# Patient Record
Sex: Female | Born: 1960 | Race: White | Hispanic: No | Marital: Married | Smoking: Never smoker
Health system: Southern US, Community
[De-identification: ages and names within clinical notes are randomized; demographics above are authoritative.]

## PROBLEM LIST (undated history)

## (undated) DIAGNOSIS — E785 Hyperlipidemia, unspecified: Secondary | ICD-10-CM

## (undated) DIAGNOSIS — A64 Unspecified sexually transmitted disease: Secondary | ICD-10-CM

## (undated) DIAGNOSIS — E039 Hypothyroidism, unspecified: Secondary | ICD-10-CM

## (undated) DIAGNOSIS — E288 Other ovarian dysfunction: Secondary | ICD-10-CM

## (undated) HISTORY — DX: Unspecified sexually transmitted disease: A64

## (undated) HISTORY — DX: Other ovarian dysfunction: E28.8

## (undated) HISTORY — DX: Hyperlipidemia, unspecified: E78.5

## (undated) HISTORY — DX: Hypothyroidism, unspecified: E03.9

---

## 2000-03-07 DIAGNOSIS — E039 Hypothyroidism, unspecified: Secondary | ICD-10-CM

## 2000-03-07 HISTORY — DX: Hypothyroidism, unspecified: E03.9

## 2000-03-14 ENCOUNTER — Encounter: Admission: RE | Admit: 2000-03-14 | Discharge: 2000-03-14 | Payer: Self-pay | Admitting: Obstetrics and Gynecology

## 2000-03-14 ENCOUNTER — Encounter: Payer: Self-pay | Admitting: Obstetrics and Gynecology

## 2000-05-09 ENCOUNTER — Other Ambulatory Visit: Admission: RE | Admit: 2000-05-09 | Discharge: 2000-05-09 | Payer: Self-pay | Admitting: Obstetrics and Gynecology

## 2001-03-29 ENCOUNTER — Encounter: Admission: RE | Admit: 2001-03-29 | Discharge: 2001-03-29 | Payer: Self-pay | Admitting: Obstetrics and Gynecology

## 2001-03-29 ENCOUNTER — Encounter: Payer: Self-pay | Admitting: Obstetrics and Gynecology

## 2001-08-05 DIAGNOSIS — E2839 Other primary ovarian failure: Secondary | ICD-10-CM

## 2001-08-05 HISTORY — DX: Other primary ovarian failure: E28.39

## 2001-08-22 ENCOUNTER — Other Ambulatory Visit: Admission: RE | Admit: 2001-08-22 | Discharge: 2001-08-22 | Payer: Self-pay | Admitting: Obstetrics and Gynecology

## 2001-08-24 ENCOUNTER — Encounter: Admission: RE | Admit: 2001-08-24 | Discharge: 2001-08-24 | Payer: Self-pay | Admitting: Obstetrics and Gynecology

## 2001-08-24 ENCOUNTER — Encounter: Payer: Self-pay | Admitting: Obstetrics and Gynecology

## 2002-04-02 ENCOUNTER — Encounter: Admission: RE | Admit: 2002-04-02 | Discharge: 2002-04-02 | Payer: Self-pay | Admitting: Obstetrics and Gynecology

## 2002-04-02 ENCOUNTER — Encounter: Payer: Self-pay | Admitting: Obstetrics and Gynecology

## 2002-08-30 ENCOUNTER — Other Ambulatory Visit: Admission: RE | Admit: 2002-08-30 | Discharge: 2002-08-30 | Payer: Self-pay | Admitting: Obstetrics and Gynecology

## 2003-04-04 ENCOUNTER — Encounter: Admission: RE | Admit: 2003-04-04 | Discharge: 2003-04-04 | Payer: Self-pay | Admitting: Obstetrics and Gynecology

## 2003-09-12 ENCOUNTER — Other Ambulatory Visit: Admission: RE | Admit: 2003-09-12 | Discharge: 2003-09-12 | Payer: Self-pay | Admitting: Obstetrics and Gynecology

## 2003-09-26 ENCOUNTER — Encounter: Admission: RE | Admit: 2003-09-26 | Discharge: 2003-09-26 | Payer: Self-pay | Admitting: Obstetrics and Gynecology

## 2004-04-08 ENCOUNTER — Encounter: Admission: RE | Admit: 2004-04-08 | Discharge: 2004-04-08 | Payer: Self-pay | Admitting: Obstetrics and Gynecology

## 2004-09-14 ENCOUNTER — Other Ambulatory Visit: Admission: RE | Admit: 2004-09-14 | Discharge: 2004-09-14 | Payer: Self-pay | Admitting: Obstetrics and Gynecology

## 2005-06-23 ENCOUNTER — Encounter: Admission: RE | Admit: 2005-06-23 | Discharge: 2005-06-23 | Payer: Self-pay | Admitting: Obstetrics and Gynecology

## 2005-09-21 ENCOUNTER — Other Ambulatory Visit: Admission: RE | Admit: 2005-09-21 | Discharge: 2005-09-21 | Payer: Self-pay | Admitting: Obstetrics & Gynecology

## 2006-09-20 ENCOUNTER — Encounter: Admission: RE | Admit: 2006-09-20 | Discharge: 2006-09-20 | Payer: Self-pay | Admitting: Obstetrics and Gynecology

## 2006-09-23 ENCOUNTER — Other Ambulatory Visit: Admission: RE | Admit: 2006-09-23 | Discharge: 2006-09-23 | Payer: Self-pay | Admitting: Obstetrics & Gynecology

## 2006-09-30 ENCOUNTER — Encounter: Admission: RE | Admit: 2006-09-30 | Discharge: 2006-09-30 | Payer: Self-pay | Admitting: Obstetrics and Gynecology

## 2007-09-21 ENCOUNTER — Encounter: Admission: RE | Admit: 2007-09-21 | Discharge: 2007-09-21 | Payer: Self-pay | Admitting: Obstetrics and Gynecology

## 2007-09-26 ENCOUNTER — Other Ambulatory Visit: Admission: RE | Admit: 2007-09-26 | Discharge: 2007-09-26 | Payer: Self-pay | Admitting: Obstetrics & Gynecology

## 2008-09-26 ENCOUNTER — Other Ambulatory Visit: Admission: RE | Admit: 2008-09-26 | Discharge: 2008-09-26 | Payer: Self-pay | Admitting: Obstetrics and Gynecology

## 2009-04-09 ENCOUNTER — Encounter: Admission: RE | Admit: 2009-04-09 | Discharge: 2009-04-09 | Payer: Self-pay | Admitting: Obstetrics & Gynecology

## 2010-01-02 LAB — HM COLONOSCOPY: HM Colonoscopy: 2

## 2010-05-14 ENCOUNTER — Encounter
Admission: RE | Admit: 2010-05-14 | Discharge: 2010-05-14 | Payer: Self-pay | Source: Home / Self Care | Attending: Obstetrics and Gynecology | Admitting: Obstetrics and Gynecology

## 2012-12-01 ENCOUNTER — Ambulatory Visit: Payer: Self-pay | Admitting: Nurse Practitioner

## 2013-01-18 ENCOUNTER — Other Ambulatory Visit: Payer: Self-pay

## 2013-01-18 DIAGNOSIS — Z1231 Encounter for screening mammogram for malignant neoplasm of breast: Secondary | ICD-10-CM

## 2013-02-01 ENCOUNTER — Encounter: Payer: Self-pay | Admitting: Nurse Practitioner

## 2013-02-01 ENCOUNTER — Ambulatory Visit (INDEPENDENT_AMBULATORY_CARE_PROVIDER_SITE_OTHER): Payer: No Typology Code available for payment source | Admitting: Nurse Practitioner

## 2013-02-01 VITALS — BP 144/82 | HR 68 | Resp 16 | Ht 61.25 in | Wt 132.0 lb

## 2013-02-01 DIAGNOSIS — Z0000 Encounter for general adult medical examination without abnormal findings: Secondary | ICD-10-CM

## 2013-02-01 DIAGNOSIS — Z01419 Encounter for gynecological examination (general) (routine) without abnormal findings: Secondary | ICD-10-CM

## 2013-02-01 LAB — POCT URINALYSIS DIPSTICK
Glucose, UA: NEGATIVE
Leukocytes, UA: NEGATIVE
Nitrite, UA: NEGATIVE
pH, UA: 5

## 2013-02-01 NOTE — Patient Instructions (Addendum)
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
## 2013-02-02 NOTE — Progress Notes (Signed)
Encounter reviewed by Dr. Igor Bishop Silva.  

## 2013-02-08 ENCOUNTER — Ambulatory Visit: Payer: Self-pay

## 2013-05-09 ENCOUNTER — Other Ambulatory Visit: Payer: Self-pay | Admitting: Nurse Practitioner

## 2013-05-09 NOTE — Telephone Encounter (Signed)
Patient wants a refill of valACYclovir (VALTREX) 1000 MG tablet  Take 1,000 mg by mouth as needed. , Last Dose: Not Recorded  She only wants it as an as needed bases. Doesn't want a lot at one time just if she needs it she can go get a few at pharmacy [facility] [address] [place] [phone]

## 2013-05-10 MED ORDER — VALACYCLOVIR HCL 1 G PO TABS: 1000.0000 mg | ORAL_TABLET | ORAL | Status: DC | PRN

## 2013-05-10 NOTE — Telephone Encounter (Signed)
Refill request for VALTREX  Last AEX - 02/01/13 Please advise refills and quantities.  Thanks.

## 2013-09-05 ENCOUNTER — Ambulatory Visit
Admission: RE | Admit: 2013-09-05 | Discharge: 2013-09-05 | Disposition: A | Discharge status: Home / Self Care | Payer: No Typology Code available for payment source | Source: Ambulatory Visit

## 2013-09-05 DIAGNOSIS — Z1231 Encounter for screening mammogram for malignant neoplasm of breast: Secondary | ICD-10-CM

## 2013-12-10 ENCOUNTER — Other Ambulatory Visit: Payer: Self-pay | Admitting: Family Medicine

## 2013-12-10 DIAGNOSIS — R1013 Epigastric pain: Secondary | ICD-10-CM

## 2013-12-17 ENCOUNTER — Encounter (INDEPENDENT_AMBULATORY_CARE_PROVIDER_SITE_OTHER): Payer: Self-pay

## 2013-12-17 ENCOUNTER — Ambulatory Visit
Admission: RE | Admit: 2013-12-17 | Discharge: 2013-12-17 | Disposition: A | Discharge status: Home / Self Care | Payer: No Typology Code available for payment source | Source: Ambulatory Visit | Attending: Family Medicine | Admitting: Family Medicine

## 2013-12-17 DIAGNOSIS — R1013 Epigastric pain: Secondary | ICD-10-CM

## 2014-02-05 ENCOUNTER — Ambulatory Visit: Payer: No Typology Code available for payment source | Admitting: Nurse Practitioner

## 2014-02-14 ENCOUNTER — Encounter: Payer: Self-pay | Admitting: Nurse Practitioner

## 2014-02-25 ENCOUNTER — Ambulatory Visit: Payer: No Typology Code available for payment source | Admitting: Nurse Practitioner

## 2014-04-08 ENCOUNTER — Encounter: Payer: Self-pay | Admitting: Nurse Practitioner

## 2014-04-12 ENCOUNTER — Ambulatory Visit: Payer: No Typology Code available for payment source | Admitting: Nurse Practitioner

## 2014-04-23 ENCOUNTER — Ambulatory Visit (INDEPENDENT_AMBULATORY_CARE_PROVIDER_SITE_OTHER): Payer: No Typology Code available for payment source | Admitting: Nurse Practitioner

## 2014-04-23 ENCOUNTER — Encounter: Payer: Self-pay | Admitting: Nurse Practitioner

## 2014-04-23 VITALS — BP 137/60 | HR 72 | Ht 61.25 in | Wt 140.0 lb

## 2014-04-23 DIAGNOSIS — Z0000 Encounter for general adult medical examination without abnormal findings: Secondary | ICD-10-CM

## 2014-04-23 DIAGNOSIS — M8580 Other specified disorders of bone density and structure, unspecified site: Secondary | ICD-10-CM

## 2014-04-23 DIAGNOSIS — N8189 Other female genital prolapse: Secondary | ICD-10-CM

## 2014-04-23 DIAGNOSIS — Z01419 Encounter for gynecological examination (general) (routine) without abnormal findings: Secondary | ICD-10-CM

## 2014-04-23 DIAGNOSIS — R8290 Unspecified abnormal findings in urine: Secondary | ICD-10-CM

## 2014-04-23 LAB — POCT URINALYSIS DIPSTICK
Bilirubin, UA: NEGATIVE
GLUCOSE UA: NEGATIVE
Leukocytes, UA: NEGATIVE
NITRITE UA: NEGATIVE
PROTEIN UA: NEGATIVE
Urobilinogen, UA: NEGATIVE
pH, UA: 5

## 2014-04-23 MED ORDER — VALACYCLOVIR HCL 1 G PO TABS: 1000.0000 mg | ORAL_TABLET | ORAL | Status: DC | PRN

## 2014-04-23 NOTE — Progress Notes (Signed)
53 y.o. 402P2002 Married Caucasian Fe here for annual exam.  History of high hamstring tendonitis of bilateral thighs since. Nov 2013.  Now better.  She had an incident on Nov 8th about midmorning where she noted a vaginal protrusion after moving some Christmas decorations.  Since then she has done some research into types of uterine and pelvic relaxation.  She has also looked at surgical and non surgical options.  She desires more information and discussion with a surgeon to see what she needs to have done.  She has been seen by Dr. Perley JainMcDiarmid in 2009 who told her repair of the rectocele would have to be done at some time.  She is postmenopausal not on HRT.  She does use OTC Replens prn.  Not SA secondary to pain.  Patient's last menstrual period was 04/16/2002.          Sexually active: Yes.    The current method of family planning is post menopausal status.    Exercising: Yes.    Home exercise routine includes biking 6 - 7 miles a day. Smoker:  no  Health Maintenance: Pap:  01/26/12, WNL, neg HR HPV MMG:  09/05/13 Bi-Rads Neg Colonoscopy:  01/02/10, 2 polyps benign repeat in 10 years BMD:   04/09/2009, T score: Spine -1.6, left hip -1.0 TDaP:  09/2006  Labs: PCP                UA: ketones - moderate, RBC - trace, ph: 5.0   reports that she has never smoked. She has never used smokeless tobacco. She reports that she does not drink alcohol or use illicit drugs.  Past Medical History  Diagnosis Date  . Hyperlipidemia   . STD (sexually transmitted disease)     HSV  . Premature ovarian failure 08/2001  . Hypothyroid 03/2000    Past Surgical History  Procedure Laterality Date  . Cesarean section      Current Outpatient Prescriptions  Medication Sig Dispense Refill  . atorvastatin (LIPITOR) 10 MG tablet Take 1 tablet by mouth daily.    Marland Kitchen. CALCIUM PO Take 2,568 mg by mouth daily. Supplement and dietary intake    . levothyroxine (SYNTHROID, LEVOTHROID) 75 MCG tablet Take 1 tablet by mouth daily.     . Multiple Vitamin (MULTIVITAMIN) tablet Take 1 tablet by mouth daily.    . NON FORMULARY Take 1,948 Units by mouth daily. VITAMIN D. Supplement and dietary    . valACYclovir (VALTREX) 1000 MG tablet Take 1 tablet (1,000 mg total) by mouth as needed. 30 tablet 12   No current facility-administered medications for this visit.    Family History  Problem Relation Age of Onset  . Stroke Mother   . Rheum arthritis Mother   . Cancer Maternal Grandmother 762    ovarian    ROS:  Pertinent items are noted in HPI.  Otherwise, a comprehensive ROS was negative.  Exam:   BP 137/60 mmHg  Pulse 72  Ht 5' 1.25" (1.556 m)  Wt 140 lb (63.504 kg)  BMI 26.23 kg/m2  LMP 04/16/2002 Height: 5' 1.25" (155.6 cm)  Ht Readings from Last 3 Encounters:  04/23/14 5' 1.25" (1.556 m)  02/01/13 5' 1.25" (1.556 m)    General appearance: alert, cooperative and appears stated age Head: Normocephalic, without obvious abnormality, atraumatic Neck: no adenopathy, supple, symmetrical, trachea midline and thyroid normal to inspection and palpation Lungs: clear to auscultation bilaterally Breasts: normal appearance, no masses or tenderness Heart: regular rate and rhythm Abdomen:  soft, non-tender; no masses,  no organomegaly Extremities: extremities normal, atraumatic, no cyanosis or edema Skin: Skin color, texture, turgor normal. No rashes or lesions Lymph nodes: Cervical, supraclavicular, and axillary nodes normal. No abnormal inguinal nodes palpated Neurologic: Grossly normal   Pelvic: External genitalia:  no lesions              Urethra:  normal appearing urethra with no masses, tenderness or lesions              Bartholin's and Skene's: normal                 Vagina: very atrophic appearing vagina with pale color and discharge, no lesions              Cervix: anteverted              Pap taken: No. Bimanual Exam:  Uterus:  normal size, contour, position, consistency, mobility, non-tender               Adnexa: no mass, fullness, tenderness               Rectovaginal: Confirms               Anus:  normal sphincter tone, no lesions  A:  Well Woman with normal exam  History of POF 3/03 at age 53  Postmenopausal no HRT  Atrophic vaginitis - has not wanted vaginal estrogen  Hypothyroid 2001 Osteopenia - repeat BMD next spring  Rectocele and pelvic floor relaxation  History of HSV  P:   Reviewed health and wellness pertinent to exam  Pap smear not taken today  Mammogram is due 4/16 and will get BMD at same time - order is placed in Epic  Will get PUS and follow with consult with Dr. Hyacinth MeekerMiller - she is aware of patient and her concerns.  She is shown Pessary types.  Refill on Valtrex for a year  Follow with urine C&S  Counseled on breast self exam, mammography screening, adequate intake of calcium and vitamin D, diet and exercise, Kegel's exercises return annually or prn  An After Visit Summary was printed and given to the patient.

## 2014-04-23 NOTE — Progress Notes (Signed)
Reviewed personally.  M. Suzanne Tanina Barb, MD.  

## 2014-04-23 NOTE — Patient Instructions (Signed)

## 2014-04-24 LAB — URINE CULTURE: Colony Count: 9000

## 2014-04-26 ENCOUNTER — Telehealth: Payer: Self-pay | Admitting: Nurse Practitioner

## 2014-04-26 NOTE — Addendum Note (Signed)
Addended by: Joeseph AmorFAST, Letita Prentiss L on: 04/26/2014 11:50 AM   Modules accepted: Orders

## 2014-04-26 NOTE — Telephone Encounter (Signed)
Left message for patient to call back. Need to go over benefits and schedule PUS °

## 2014-04-26 NOTE — Telephone Encounter (Signed)
Patient returned call. I advised patient that per benefit quote received, she will be responsible for $360.24 when she comes in for PUS. Patient agreeable. Scheduled PUS. Advised patient of 72 hour cancellation policy and $100 cancellation fee. Patient agreeable. (since today is Friday, patient must notify us by Friday night)

## 2014-04-30 ENCOUNTER — Ambulatory Visit (INDEPENDENT_AMBULATORY_CARE_PROVIDER_SITE_OTHER): Payer: No Typology Code available for payment source | Admitting: Obstetrics & Gynecology

## 2014-04-30 ENCOUNTER — Ambulatory Visit (INDEPENDENT_AMBULATORY_CARE_PROVIDER_SITE_OTHER): Payer: No Typology Code available for payment source

## 2014-04-30 VITALS — BP 138/88 | Ht 61.25 in | Wt 142.0 lb

## 2014-04-30 DIAGNOSIS — N8110 Cystocele, unspecified: Secondary | ICD-10-CM

## 2014-04-30 DIAGNOSIS — N816 Rectocele: Secondary | ICD-10-CM

## 2014-04-30 DIAGNOSIS — IMO0002 Reserved for concepts with insufficient information to code with codable children: Secondary | ICD-10-CM

## 2014-04-30 DIAGNOSIS — N8189 Other female genital prolapse: Secondary | ICD-10-CM

## 2014-04-30 NOTE — Progress Notes (Signed)
53 y.o. G2P2 Marriedfemale with pelvic relaxation here for PUS and to discuss treatment options.  Pt does feel a vaginal bulge at times.  Denies any issues with emptying bladder.  No incontinence.  Denies vaginal bleeding or discharge.  Has done a lot of research and has many questions.  Brought a list with her today.  Patient's last menstrual period was 04/16/2002.  Sexually active:  yes  Contraception: PMP state  FINDINGS: UTERUS: 5.0 x 4.4 x 2.5cm EMS: 1.749mm ADNEXA:   Left ovary 1.6 x 0.8 x 0.8cm   Right ovary 1.9 x 0.9 x 1.0cm CUL DE SAC:  Free fluid noted in PCS measuring 3.4 x 1.2 x 3.5cm and appearance of peritoneal inclusion cysts noted  Images reviewed with pt.  Pt reports normal labs with PCP this year.  Physical exam performed on pt showing pelvic relaxation with cystocele and rectocele.  Findings d/w pt.  PT, pessary use, surgical correction d/w pt.  She is not sure she wants to proceed with anything right now as she is really not symptomatic in any other way.    Assessment:  Normal PUS except for peritoneal inclusion cysts and pelvic relaxation.  Plan: Pt considering options.  Will call back if decides she needs to proceed with any additional consultation.  For now, she thinks she will wait.  ~15 minutes spent with patient >50% of time was in face to face discussion of above.

## 2014-05-10 ENCOUNTER — Encounter: Payer: Self-pay | Admitting: Obstetrics & Gynecology

## 2014-05-10 DIAGNOSIS — IMO0002 Reserved for concepts with insufficient information to code with codable children: Secondary | ICD-10-CM | POA: Insufficient documentation

## 2014-05-10 DIAGNOSIS — N816 Rectocele: Secondary | ICD-10-CM | POA: Insufficient documentation

## 2014-12-05 ENCOUNTER — Emergency Department (HOSPITAL_COMMUNITY): Payer: 59

## 2014-12-05 ENCOUNTER — Emergency Department (HOSPITAL_COMMUNITY)
Admission: EM | Admit: 2014-12-05 | Discharge: 2014-12-05 | Disposition: A | Discharge status: Home / Self Care | Payer: 59 | Attending: Emergency Medicine | Admitting: Emergency Medicine

## 2014-12-05 ENCOUNTER — Encounter (HOSPITAL_COMMUNITY): Payer: Self-pay | Admitting: *Deleted

## 2014-12-05 DIAGNOSIS — Z79899 Other long term (current) drug therapy: Secondary | ICD-10-CM | POA: Diagnosis not present

## 2014-12-05 DIAGNOSIS — E039 Hypothyroidism, unspecified: Secondary | ICD-10-CM | POA: Diagnosis not present

## 2014-12-05 DIAGNOSIS — Z880 Allergy status to penicillin: Secondary | ICD-10-CM | POA: Insufficient documentation

## 2014-12-05 DIAGNOSIS — Z8619 Personal history of other infectious and parasitic diseases: Secondary | ICD-10-CM | POA: Diagnosis not present

## 2014-12-05 DIAGNOSIS — R0989 Other specified symptoms and signs involving the circulatory and respiratory systems: Secondary | ICD-10-CM | POA: Diagnosis present

## 2014-12-05 DIAGNOSIS — E785 Hyperlipidemia, unspecified: Secondary | ICD-10-CM | POA: Diagnosis not present

## 2014-12-05 DIAGNOSIS — J392 Other diseases of pharynx: Secondary | ICD-10-CM | POA: Diagnosis not present

## 2014-12-05 NOTE — Consult Note (Signed)
Michelle Grimes, Michelle Grimes [id number] 08/24/1960 Mancel BaleElliott Wentz, MD  Reason for Consult: possible fish bone ingestion  HPI: 53yo who was eating salmon last night and thinks a fish bone became lodged in her nose or oropharynx. ENt consulted for the above  Allergies:  Allergies  Allergen Reactions  . Amoxicillin   . Azithromycin     ROS: Review of systems postive for sore throat, globus sensation, otherwise normal other x 12 systems except per HPI.  PMH:  Past Medical History  Diagnosis Date  . Hyperlipidemia   . STD (sexually transmitted disease)     HSV  . Premature ovarian failure 08/2001  . Hypothyroid 03/2000    FH:  Family History  Problem Relation Age of Onset  . Stroke Mother   . Rheum arthritis Mother   . Cancer Maternal Grandmother 5162    ovarian    SH:  History   Social History  . Marital Status: Married    Spouse Name: N/A  . Number of Children: N/A  . Years of Education: N/A   Occupational History  . Not on file.   Social History Main Topics  . Smoking status: Never Smoker   . Smokeless tobacco: Never Used  . Alcohol Use: No  . Drug Use: No  . Sexual Activity:    Partners: Male    Birth Control/ Protection: Surgical     Comment: vasectomy   Other Topics Concern  . Not on file   Social History Narrative    PSH:  Past Surgical History  Procedure Laterality Date  . Cesarean section      Physical  Exam: CN 2-12 grossly intact and symmetric. EAC/TMs normal BL. Oral cavity, lips, gums, ororpharynx normal with no masses or lesions. Skin warm and dry. Nasal cavity without polyps or purulence. External nose and ears without masses or lesions. EOMI, PERRLA. Neck supple with no masses or lesions. No lymphadenopathy palpated. Thyroid normal with no masses. Larynx cannot be seen on transoral exam.  Procedure Note: 31575 Informed verbal consent was obtained after explaining the risks (including bleeding and infection), benefits and alternatives of the procedure.  Verbal timeout was performed prior to the procedure. The nose was topicalized with topical lidocaine/oxymetazoline. The 4mm flexible scope was advanced through the bilateral  nasal cavity. The septum and turbinates appeared normal. The middle meatus was free of polyps or purulence. The eustachian tube, choana, and adenoids were normal in appearance. The hypopharynx, arytenoids, false vocal folds, and true vocal folds appeared normal with normal adduction and abduction bilaterally. The visualized portion of the subglottis appeared normal. The patient tolerated the procedure with no immediate complications. No fishbones, masses, lesions, or foreign bodies were seen anywhere in the nasopharynx, larynx, or oropharynx.  IMAGING: CT neck without contrast and lateral neck Xray show no foreign bodies or masses.  A/P: CT neck, lateral neck Xray, and flexible laryngoscopy all show no nasal, pharyngeal, laryngeal, or esophageal foreign bodies or fish bones. Follow up as needed.   Melvenia BeamGore, Verlene Glantz 12/05/2014 5:17 PM

## 2014-12-05 NOTE — ED Notes (Signed)
Paged Ortho for PA-Cartner

## 2014-12-05 NOTE — ED Provider Notes (Signed)
CSN: [id number]     Arrival date & time 12/05/14  1418 History   First MD Initiated Contact with Patient 12/05/14 1533     Chief Complaint  Patient presents with  . possible bone in throat      (Consider location/radiation/quality/duration/timing/severity/associated sxs/prior Treatment) HPI Michelle Grimes is a 54 y.o. female who comes in for evaluation of potential swallowed foreign body. Patient states last night at approximately 7:00 PM she was eating salmon and may have swallowed a fishbone. She reports immediate discomfort after swallowing the fish. She feels like the bone may be lodged "in the back of my nasal cavity". She denies any difficulties breathing, swallowing, no coughing, chest pain. She has tried swallowing bread and marshmallows to try to dislodge the item, but without success. [facility] who told her that Dr. Emeline DarlingGore from ENT would meet her in the emergency department.  Past Medical History  Diagnosis Date  . Hyperlipidemia   . STD (sexually transmitted disease)     HSV  . Premature ovarian failure 08/2001  . Hypothyroid 03/2000   Past Surgical History  Procedure Laterality Date  . Cesarean section     Family History  Problem Relation Age of Onset  . Stroke Mother   . Rheum arthritis Mother   . Cancer Maternal Grandmother 5762    ovarian   History  Substance Use Topics  . Smoking status: Never Smoker   . Smokeless tobacco: Never Used  . Alcohol Use: No   OB History    Gravida Para Term Preterm AB TAB SAB Ectopic Multiple Living   2 2 2       2      Review of Systems A 10 point review of systems was completed and was negative except for pertinent positives and negatives as mentioned in the history of present illness     Allergies  Amoxicillin and Azithromycin  Home Medications   Prior to Admission medications   Medication Sig Start Date End Date Taking? Authorizing Provider  ALPRAZolam Prudy Feeler(XANAX) 0.25 MG tablet Take 0.25 mg by  mouth daily as needed for anxiety.  11/22/14  Yes Historical Provider, MD  atorvastatin (LIPITOR) 10 MG tablet Take 10 mg by mouth daily at 6 PM.  01/16/13  Yes Historical Provider, MD  CALCIUM PO Take 2,568 mg by mouth at bedtime. Supplement and dietary intake   Yes Historical Provider, MD  levothyroxine (SYNTHROID, LEVOTHROID) 75 MCG tablet Take 75 mcg by mouth daily before breakfast.  12/29/12  Yes Historical Provider, MD  Multiple Vitamin (MULTIVITAMIN) tablet Take 1 tablet by mouth at bedtime.    Yes Historical Provider, MD  NON FORMULARY Take 1,948 Units by mouth at bedtime. VITAMIN D. Supplement and dietary   Yes Historical Provider, MD  sodium chloride (OCEAN) 0.65 % SOLN nasal spray Place 1 spray into both nostrils at bedtime.   Yes Historical Provider, MD  valACYclovir (VALTREX) 1000 MG tablet Take 1 tablet (1,000 mg total) by mouth as needed. Patient taking differently: Take 1,000 mg by mouth as needed (outbreak).  04/23/14  Yes Ria CommentPatricia Grubb, FNP   BP 165/81 mmHg  Pulse 85  Temp(Src) 98.4 F (36.9 C) (Oral)  Resp 17  Ht 5\' 1"  (1.549 m)  Wt 147 lb (66.679 kg)  BMI 27.79 kg/m2  SpO2 100%  LMP 04/16/2002 Physical Exam  Constitutional: She is oriented to person, place, and time. She appears well-developed and well-nourished.  HENT:  Head: Normocephalic and atraumatic.  Mouth/Throat: Oropharynx is clear and moist. No oropharyngeal exudate.  No evidence of foreign body. Bilateral nares clear without any rhinorrhea, erythema.  Eyes: Conjunctivae are normal. Pupils are equal, round, and reactive to light. Right eye exhibits no discharge. Left eye exhibits no discharge. No scleral icterus.  Neck: Neck supple.  Cardiovascular: Normal rate, regular rhythm and normal heart sounds.   Pulmonary/Chest: Effort normal and breath sounds normal. No respiratory distress. She has no wheezes. She has no rales.  Abdominal: Soft. There is no tenderness.  Musculoskeletal: She exhibits no tenderness.   Neurological: She is alert and oriented to person, place, and time.  Cranial Nerves II-XII grossly intact  Skin: Skin is warm and dry. No rash noted.  Psychiatric: She has a normal mood and affect.  Nursing note and vitals reviewed.   ED Course  Procedures (including critical care time) Labs Review Labs Reviewed - No data to display  Imaging Review Dg Neck Soft Tissue  12/05/2014   CLINICAL DATA:  54 year old female with a history of foreign body in the throat.  EXAM: NECK SOFT TISSUES - 1+ VIEW  COMPARISON:  None.  FINDINGS: Unremarkable appearance of the cervical elements which maintain anatomic alignment.  Posterior cervical soft tissues unremarkable.  Vague linear density within the supraglottic prevertebral soft tissues anterior to C4, inferior to the hyoid and superior to the thyroid cartilage measuring approximately 2.3 cm.  IMPRESSION: Linear density within the supraglottic prevertebral soft tissues anterior to C4. This is nonspecific, potentially representing vascular calcifications, though could potentially represent a foreign body given the history of foreign body ingestion.  Signed,  Yvone Neu. Loreta Ave, DO  [facility]  [facility]   Electronically Signed   By: Gilmer Mor D.O.   On: 12/05/2014 16:04   Ct Soft Tissue Neck Wo Contrast  12/05/2014   CLINICAL DATA:  Pt feels like a fish bone is stuck in right side of throat at base of nasal cavity. Pt denies any problems with breathing or swallowing.  EXAM: CT NECK WITHOUT CONTRAST  TECHNIQUE: Multidetector CT imaging of the neck was performed following the standard protocol without intravenous contrast.  COMPARISON:  Soft tissue neck radiographs, 12/05/2014.  FINDINGS: There is no radiopaque foreign body within the airway, visible esophagus or soft tissues. No evidence of a fish bone.  Airway is widely patent. Incidental note is made of a right posterior lateral tracheal diverticulum just  below the thoracic inlet.  The radio opacity noted on the current standard radiographs reflects calcification of the arytenoid cartilages, a normal finding.  Limited intracranial: Unremarkable.  Limited globes and orbits: Unremarkable.  Sinuses and mastoid air cells: Visualized sinuses and mastoid air cells are clear.  Lymph nodes:  No pathologically enlarged lymph nodes.  Major salivary glands:  Unremarkable.  Musculoskeletal:  Unremarkable.  Lung apices: Clear.  IMPRESSION: No evidence of a radiopaque foreign body. No acute finding. No mass or adenopathy.   Electronically Signed   By: Amie Portland M.D.   On: 12/05/2014 17:00     EKG Interpretation None     Meds given in ED:  Medications - No data to display  New Prescriptions   No medications on file   Filed Vitals:   12/05/14 1447 12/05/14 1530 12/05/14 1531  BP: 171/85 165/81 165/81  Pulse: 86 89 85  Temp: 98.4 F (36.9 C)    TempSrc: Oral    Resp: 16  17  Height: 5\' 1"  (1.549 m)  Weight: 147 lb (66.679 kg)    SpO2: 98% 96% 100%    MDM  Vitals stable - WNL -afebrile Pt resting comfortably in ED. PE--physical exam is not concerning. No obvious foreign body detected in the oropharynx or nasopharynx. Normal lung exam, patent airway. Spoke with Dr. Emeline Darling, ENT requests CT neck soft tissue without contrast. CT neck shows no evidence of swallowed foreign body. Dr. Emeline Darling saw patient in ED. No evidence of swallowed foreign body.  I personally reviewed the imaging and agree with the results as interpreted by the radiologist.  I discussed all relevant lab findings and imaging results with pt and they verbalized understanding. Discussed f/u with PCP within 48 hrs and return precautions, pt very amenable to plan.  Final diagnoses:  Throat irritation       Joycie Peek, PA-C 12/05/14 1827  Mancel Bale, MD 12/05/14 [phone]

## 2014-12-05 NOTE — ED Notes (Addendum)
Pt states she feels as if she has a fish bone stuck in there throat since last night.  Called her pcp and they stated to come here.  No difficulty swallowing.  Airway patent.  Pt states she thinks the ENT was supposed to meet her here, but she's not sure.

## 2014-12-05 NOTE — Discharge Instructions (Signed)
You were evaluated in the ED today and there does not appear to be any emergent cause for your throat symptoms at this time. There does not appear to be a fishbone lodged in your throat at this time. Please follow-up with primary care for further evaluation management of your symptoms.

## 2014-12-05 NOTE — ED Notes (Signed)
Pt A&OX4, ambulatory at d/c with steady gait, NAD 

## 2014-12-05 NOTE — ED Notes (Signed)
Pt reports she was eating Salmon last night at 6pm and thinks she has a piece lodged in her nasal cavity. She states she doesn't think it is in her throat but more so in her nasal cavity. She denies painful swallowing, coughing, denies coughing up blood and denies trouble breathing.

## 2014-12-13 ENCOUNTER — Other Ambulatory Visit: Payer: Self-pay | Admitting: Family Medicine

## 2014-12-13 ENCOUNTER — Ambulatory Visit
Admission: RE | Admit: 2014-12-13 | Discharge: 2014-12-13 | Disposition: A | Discharge status: Home / Self Care | Payer: 59 | Source: Ambulatory Visit | Attending: Family Medicine | Admitting: Family Medicine

## 2014-12-13 DIAGNOSIS — R109 Unspecified abdominal pain: Secondary | ICD-10-CM

## 2015-02-28 ENCOUNTER — Telehealth: Payer: Self-pay | Admitting: Nurse Practitioner

## 2015-02-28 NOTE — Telephone Encounter (Signed)
As GYN we do not write those kind of notes and must come from PCP.

## 2015-02-28 NOTE — Telephone Encounter (Signed)
Routing to Patricia Grubb, FNP for review and advise. 

## 2015-02-28 NOTE — Telephone Encounter (Signed)
Patient would like Michelle Grimes to write a letter or note excusing her from jury duty on May 07, 2015 because of her high anxiety levels.

## 2015-03-03 NOTE — Telephone Encounter (Signed)
Spoke with patient. Advised of message as seen below from Ria Comment, FNP. Patient is agreeable and verbalizes understanding. Will contact her PCP regarding letter.  Routing to provider for final review. Patient agreeable to disposition. Will close encounter.

## 2015-05-06 ENCOUNTER — Ambulatory Visit: Payer: No Typology Code available for payment source | Admitting: Nurse Practitioner

## 2015-06-18 ENCOUNTER — Ambulatory Visit (INDEPENDENT_AMBULATORY_CARE_PROVIDER_SITE_OTHER): Payer: BLUE CROSS/BLUE SHIELD | Admitting: Nurse Practitioner

## 2015-06-18 ENCOUNTER — Other Ambulatory Visit: Payer: Self-pay

## 2015-06-18 ENCOUNTER — Encounter: Payer: Self-pay | Admitting: Nurse Practitioner

## 2015-06-18 VITALS — BP 142/86 | HR 96 | Ht 61.0 in | Wt 135.0 lb

## 2015-06-18 DIAGNOSIS — M8580 Other specified disorders of bone density and structure, unspecified site: Secondary | ICD-10-CM | POA: Diagnosis not present

## 2015-06-18 DIAGNOSIS — Z1211 Encounter for screening for malignant neoplasm of colon: Secondary | ICD-10-CM | POA: Diagnosis not present

## 2015-06-18 DIAGNOSIS — Z01419 Encounter for gynecological examination (general) (routine) without abnormal findings: Secondary | ICD-10-CM | POA: Diagnosis not present

## 2015-06-18 DIAGNOSIS — Z0000 Encounter for general adult medical examination without abnormal findings: Secondary | ICD-10-CM | POA: Diagnosis not present

## 2015-06-18 DIAGNOSIS — Z1231 Encounter for screening mammogram for malignant neoplasm of breast: Secondary | ICD-10-CM

## 2015-06-18 DIAGNOSIS — N8189 Other female genital prolapse: Secondary | ICD-10-CM | POA: Diagnosis not present

## 2015-06-18 LAB — POCT URINALYSIS DIPSTICK
Bilirubin, UA: NEGATIVE
Blood, UA: NEGATIVE
Glucose, UA: NEGATIVE
KETONES UA: NEGATIVE
Leukocytes, UA: NEGATIVE
Nitrite, UA: NEGATIVE
PH UA: 7
PROTEIN UA: NEGATIVE
Urobilinogen, UA: NEGATIVE

## 2015-06-18 NOTE — Progress Notes (Signed)
Patient ID: Michelle Grimes, female   DOB: Apr 09, 1961, 55 y.o.   MRN: [id number]   55 y.o. [id number] Married  Caucasian Fe here for annual exam.  Doing well this past year.  Husband is now retired and they have time to be together. She will schedule this month for a mammogram and will get BMD as well.  Since last here for AEX, she had evaluation for cystocele with a normal PUS 04/2014.  She declined other treatment options.  She has limited her activities that may cause her to push or pull so that she has less distress over the pelvic floor weakness.  She has seen Uro in the past.  Patient's last menstrual period was 04/16/2002 (exact date).          Sexually active: Yes.    The current method of family planning is post menopausal status.    Exercising: Yes.    biking 40-50 miles (on pretty days) per week  Smoker:  no  Health Maintenance: Pap: 01/26/12, Negative with neg HR HPV MMG: 09/05/13 Bi-Rads 1: Negative scheduled this month Colonoscopy: 01/02/10, 2 polyps benign repeat in 10 years BMD: 04/09/2009, T score: Spine -1.6, left hip -1.0 TDaP: 10/05/06 Hep C and HIV: will complete today Labs: PCP takes care of all labs  Urine: negative   reports that she has never smoked. She has never used smokeless tobacco. She reports that she does not drink alcohol or use illicit drugs.  Past Medical History  Diagnosis Date  . Hyperlipidemia   . STD (sexually transmitted disease)     HSV  . Premature ovarian failure 08/2001  . Hypothyroid 03/2000    Past Surgical History  Procedure Laterality Date  . Cesarean section      Current Outpatient Prescriptions  Medication Sig Dispense Refill  . ALPRAZolam (XANAX) 0.25 MG tablet Take 0.25 mg by mouth daily as needed for anxiety.   0  . atorvastatin (LIPITOR) 10 MG tablet Take 10 mg by mouth daily at 6 PM.     . CALCIUM PO Take 2,568 mg by mouth at bedtime. Supplement and dietary intake    . levothyroxine (SYNTHROID, LEVOTHROID) 75 MCG tablet Take 75  mcg by mouth daily before breakfast.     . Multiple Vitamin (MULTIVITAMIN) tablet Take 1 tablet by mouth at bedtime.     . NON FORMULARY Take 1,948 Units by mouth at bedtime. VITAMIN D. Supplement and dietary    . omeprazole (PRILOSEC) 40 MG capsule Take 1 capsule by mouth daily.  1  . sodium chloride (OCEAN) 0.65 % SOLN nasal spray Place 1 spray into both nostrils at bedtime.    . valACYclovir (VALTREX) 1000 MG tablet Take 1 tablet (1,000 mg total) by mouth as needed. (Patient taking differently: Take 1,000 mg by mouth as needed (outbreak). ) 30 tablet 12   No current facility-administered medications for this visit.    Family History  Problem Relation Age of Onset  . Stroke Mother   . Rheum arthritis Mother   . Cancer Maternal Grandmother 70    ovarian    ROS:  Pertinent items are noted in HPI.  Otherwise, a comprehensive ROS was negative.  Exam:   BP 142/86 mmHg  Pulse 96  Ht 5\' 1"  (1.549 m)  Wt 135 lb (61.236 kg)  BMI 25.52 kg/m2  LMP 04/16/2002 (Exact Date) Height: 5\' 1"  (154.9 cm) Ht Readings from Last 3 Encounters:  06/18/15 5\' 1"  (1.549 m)  12/05/14 5\' 1"  (1.549 m)  04/30/14 5' 1.25" (1.556 m)    General appearance: alert, cooperative and appears stated age Head: Normocephalic, without obvious abnormality, atraumatic Neck: no adenopathy, supple, symmetrical, trachea midline and thyroid normal to inspection and palpation Lungs: clear to auscultation bilaterally Breasts: normal appearance, no masses or tenderness Heart: regular rate and rhythm Abdomen: soft, non-tender; no masses,  no organomegaly Extremities: extremities normal, atraumatic, no cyanosis or edema Skin: Skin color, texture, turgor normal. No rashes or lesions Lymph nodes: Cervical, supraclavicular, and axillary nodes normal. No abnormal inguinal nodes palpated Neurologic: Grossly normal   Pelvic: External genitalia:  no lesions              Urethra:  normal appearing urethra with no masses,  tenderness or lesions              Bartholin's and Skene's: normal                 Vagina: normal appearing vagina with normal color and discharge, no lesions, cystocele and mild rectocele              Cervix: anteverted              Pap taken: Yes.   Bimanual Exam:  Uterus:  normal size, contour, position, consistency, mobility, non-tender              Adnexa: no mass, fullness, tenderness               Rectovaginal: Confirms               Anus:  normal sphincter tone, no lesions  Chaperone present: yes  A:  Well Woman with normal exam  History of POF 3/03 at age 55 Postmenopausal no HRT Atrophic vaginitis - has not wanted vaginal estrogen Hypothyroid 2001 Osteopenia - will repeat BMD this month Cystocele, Rectocele and pelvic floor relaxation History of HSV  P:   Reviewed health and wellness pertinent to exam  Pap smear as above  Mammogram is due and will schedule along with BMD  Follow with labs  IFOB is given  Will get urology referral for - pelvic PT for symptomatic pelvic floor weakness  Counseled on breast self exam, mammography screening, adequate intake of calcium and vitamin D, diet and exercise, Kegel's exercises return annually or prn  An After Visit Summary was printed and given to the patient.

## 2015-06-18 NOTE — Patient Instructions (Signed)

## 2015-06-19 LAB — HIV ANTIBODY (ROUTINE TESTING W REFLEX): HIV 1&2 Ab, 4th Generation: NONREACTIVE

## 2015-06-19 LAB — HEPATITIS C ANTIBODY: HCV AB: NEGATIVE

## 2015-06-20 LAB — IPS PAP TEST WITH HPV

## 2015-06-21 NOTE — Progress Notes (Signed)
Encounter reviewed by Dr. Brook Amundson C. Silva.  

## 2015-06-23 LAB — HEMOGLOBIN, FINGERSTICK: HEMOGLOBIN, FINGERSTICK: 14 g/dL (ref 12.0–16.0)

## 2015-07-16 ENCOUNTER — Ambulatory Visit
Admission: RE | Admit: 2015-07-16 | Discharge: 2015-07-16 | Disposition: A | Discharge status: Home / Self Care | Payer: BLUE CROSS/BLUE SHIELD | Source: Ambulatory Visit

## 2015-07-16 ENCOUNTER — Ambulatory Visit
Admission: RE | Admit: 2015-07-16 | Discharge: 2015-07-16 | Disposition: A | Discharge status: Home / Self Care | Payer: BLUE CROSS/BLUE SHIELD | Source: Ambulatory Visit | Attending: Nurse Practitioner | Admitting: Nurse Practitioner

## 2015-07-16 DIAGNOSIS — Z1231 Encounter for screening mammogram for malignant neoplasm of breast: Secondary | ICD-10-CM

## 2015-07-16 DIAGNOSIS — M8580 Other specified disorders of bone density and structure, unspecified site: Secondary | ICD-10-CM

## 2015-07-17 ENCOUNTER — Telehealth: Payer: Self-pay | Admitting: Nurse Practitioner

## 2015-07-17 NOTE — Telephone Encounter (Signed)
Left voicemail in order to convey referral appointment information

## 2015-07-21 ENCOUNTER — Other Ambulatory Visit: Payer: Self-pay | Admitting: Nurse Practitioner

## 2015-07-21 NOTE — Telephone Encounter (Signed)
Medication refill request: Valtrex Last AEX:  06-18-15 Next AEX: 06-21-16  Last MMG (if hormonal medication request): 07-16-15 WNL Refill authorized: please advise  Sending to Dr. Hyacinth Meeker  since [facility] is out of the office today -eh

## 2015-12-11 DIAGNOSIS — Z0000 Encounter for general adult medical examination without abnormal findings: Secondary | ICD-10-CM | POA: Diagnosis not present

## 2015-12-11 DIAGNOSIS — E782 Mixed hyperlipidemia: Secondary | ICD-10-CM | POA: Diagnosis not present

## 2015-12-11 DIAGNOSIS — E039 Hypothyroidism, unspecified: Secondary | ICD-10-CM | POA: Diagnosis not present

## 2016-03-15 DIAGNOSIS — Z23 Encounter for immunization: Secondary | ICD-10-CM | POA: Diagnosis not present

## 2016-06-18 ENCOUNTER — Encounter: Payer: Self-pay | Admitting: Nurse Practitioner

## 2016-06-18 NOTE — Progress Notes (Signed)
Patient ID: Michelle Grimes, female   DOB: 11/28/1960, 56 y.o.   MRN: [id number]  56 y.o. [id number] Married  Caucasian Fe here for annual exam. No new health problems this year.  Michelle Grimes likes retirement and husband also retired. Michelle Grimes has Osteopenia and is doing weight beating exercise.  Patient's last menstrual period was 04/16/2002 (exact date).          Sexually active: Yes.    The current method of family planning is post menopausal status.    Exercising: Yes.    Home exercise routine includes bicycle. Smoker:  no  Health Maintenance: Pap:06/18/15, Negative with neg HR HPV MMG:07/16/15, Bi-Rads 1: Negative Colonoscopy: 01/02/10, 2 polyps benign repeat in 10 years BMD:07/16/15 T Score: -1.9 Spine / -1.3 Right Femur Neck / -1.5 Left Femur Neck TDaP: 10/05/06 Hep C and HIV: 06/18/15 Labs: PCP takes care of labs  HB: 14.0  Urine: negative   reports that Michelle Grimes has never smoked. Michelle Grimes has never used smokeless tobacco. Michelle Grimes reports that Michelle Grimes does not drink alcohol or use drugs.  Past Medical History:  Diagnosis Date  . Hyperlipidemia   . Hypothyroid 03/2000  . Premature ovarian failure 08/2001  . STD (sexually transmitted disease)    HSV    Past Surgical History:  Procedure Laterality Date  . CESAREAN SECTION      Current Outpatient Prescriptions  Medication Sig Dispense Refill  . ALPRAZolam (XANAX) 0.25 MG tablet Take 0.25 mg by mouth daily as needed for anxiety.   0  . atorvastatin (LIPITOR) 10 MG tablet Take 10 mg by mouth daily at 6 PM.     . CALCIUM PO Take 2,568 mg by mouth at bedtime. Supplement and dietary intake    . levothyroxine (SYNTHROID, LEVOTHROID) 75 MCG tablet Take 75 mcg by mouth daily before breakfast.     . Multiple Vitamin (MULTIVITAMIN) tablet Take 1 tablet by mouth at bedtime.     . NON FORMULARY Take 400 Units by mouth at bedtime. VITAMIN D. Supplement and dietary    . omeprazole (PRILOSEC) 40 MG capsule Take 1 capsule by mouth daily.  1  . sodium chloride (OCEAN) 0.65 %  SOLN nasal spray Place 1 spray into both nostrils at bedtime.    . valACYclovir (VALTREX) 1000 MG tablet take 1 tablet by mouth daily if needed 30 tablet 12   No current facility-administered medications for this visit.     Family History  Problem Relation Age of Onset  . Stroke Mother   . Rheum arthritis Mother   . Cancer Maternal Grandmother [id number]    ovarian    ROS:  Pertinent items are noted in HPI.  Otherwise, a comprehensive ROS was negative.  Exam:   BP (!) 132/94 (BP Location: Right Arm, Patient Position: Sitting, Cuff Size: Normal)   Pulse 92   Ht 5' 1.25" (1.556 m)   Wt 144 lb (65.3 kg)   LMP 04/16/2002 (Exact Date)   BMI 26.99 kg/m  Height: 5' 1.25" (155.6 cm) Ht Readings from Last 3 Encounters:  06/21/16 5' 1.25" (1.556 m)  06/18/15 5\' 1"  (1.549 m)  12/05/14 5\' 1"  (1.549 m)    General appearance: alert, cooperative and appears stated age Head: Normocephalic, without obvious abnormality, atraumatic Neck: no adenopathy, supple, symmetrical, trachea midline and thyroid normal to inspection and palpation Lungs: clear to auscultation bilaterally Breasts: normal appearance, no masses or tenderness Heart: regular rate and rhythm Abdomen: soft, non-tender; no masses,  no organomegaly Extremities: extremities normal, atraumatic, no  cyanosis or edema Skin: Skin color, texture, turgor normal. No rashes or lesions Lymph nodes: Cervical, supraclavicular, and axillary nodes normal. No abnormal inguinal nodes palpated Neurologic: Grossly normal   Pelvic: External genitalia:  no lesions              Urethra:  normal appearing urethra with no masses, tenderness or lesions              Bartholin's and Skene's: normal                 Vagina:very atrophic appearing vagina with normal color and discharge, no lesions.  There is pelvic floor relaxation.              Cervix: anteverted              Pap taken: No. Bimanual Exam:  Uterus:  normal size, contour, position, consistency,  mobility, non-tender              Adnexa: no mass, fullness, tenderness               Rectovaginal: Confirms               Anus:  normal sphincter tone, no lesions  Chaperone present: no  A:  Well Woman with normal exam  History of POF 3/03 at age 50 Postmenopausal no HRT Atrophic vaginitis - has not wanted vaginal estrogen Hypothyroid 2001 Osteopenia - stable Cystocele, Rectocele and pelvic floor relaxation History of HSV    P:   Reviewed health and wellness pertinent to exam  Pap smear as above  Mammogram is due 07/2016  Also TDaP is due in April and will get at PCP  IFOB is given  Did not need a refill on Valtrex  Counseled on breast self exam, mammography screening, osteoporosis, adequate intake of calcium and vitamin D, diet and exercise, Kegel's exercises return annually or prn  An After Visit Summary was printed and given to the patient.

## 2016-06-21 ENCOUNTER — Ambulatory Visit (INDEPENDENT_AMBULATORY_CARE_PROVIDER_SITE_OTHER): Payer: BLUE CROSS/BLUE SHIELD | Admitting: Nurse Practitioner

## 2016-06-21 ENCOUNTER — Encounter: Payer: Self-pay | Admitting: Nurse Practitioner

## 2016-06-21 VITALS — BP 132/94 | HR 92 | Ht 61.25 in | Wt 144.0 lb

## 2016-06-21 DIAGNOSIS — Z0000 Encounter for general adult medical examination without abnormal findings: Secondary | ICD-10-CM | POA: Diagnosis not present

## 2016-06-21 DIAGNOSIS — Z1211 Encounter for screening for malignant neoplasm of colon: Secondary | ICD-10-CM | POA: Diagnosis not present

## 2016-06-21 DIAGNOSIS — Z01419 Encounter for gynecological examination (general) (routine) without abnormal findings: Secondary | ICD-10-CM | POA: Diagnosis not present

## 2016-06-21 LAB — POCT URINALYSIS DIPSTICK
Bilirubin, UA: NEGATIVE
GLUCOSE UA: NEGATIVE
Ketones, UA: NEGATIVE
Leukocytes, UA: NEGATIVE
NITRITE UA: NEGATIVE
PROTEIN UA: NEGATIVE
RBC UA: NEGATIVE
UROBILINOGEN UA: NEGATIVE
pH, UA: 5

## 2016-06-21 NOTE — Patient Instructions (Signed)

## 2016-06-24 NOTE — Progress Notes (Signed)
Encounter reviewed by Dr. Brook Amundson C. Silva.  

## 2016-06-29 LAB — HEMOGLOBIN, FINGERSTICK: Hemoglobin, fingerstick: 14 g/dL (ref 12.0–15.0)

## 2016-08-30 ENCOUNTER — Telehealth: Payer: Self-pay

## 2016-08-30 NOTE — Telephone Encounter (Signed)
-----   Message from Ria CommentPatricia Grubb, FNP sent at 08/30/2016 10:03 AM EDT ----- Please call pt to remind her of IFOB test. ----- Message ----- From: SYSTEM Sent: 08/24/2016  12:06 AM To: Ria CommentPatricia Grubb, FNP

## 2016-08-30 NOTE — Progress Notes (Signed)
Spoke with patient, will get stool test done and send it back in.

## 2016-08-30 NOTE — Telephone Encounter (Signed)
Spoke with patient regarding stool kit. Patient agreeable, will complete stool test and send back to [place] as soon as she can.

## 2016-08-30 NOTE — Telephone Encounter (Signed)
I have extended the date until 09/13/16.  Thanks for the update.

## 2016-08-31 ENCOUNTER — Encounter: Payer: Self-pay | Admitting: *Deleted

## 2016-08-31 NOTE — Progress Notes (Signed)
Patient has not returned IFOB kit.  Letter printed and mailed.  Order extended to 10/04/16.

## 2016-09-06 LAB — FECAL OCCULT BLOOD, IMMUNOCHEMICAL: IMMUNOLOGICAL FECAL OCCULT BLOOD TEST: NEGATIVE

## 2016-09-06 NOTE — Addendum Note (Signed)
Addended by: Zenovia Jordan A on: 09/06/2016 09:38 AM   Modules accepted: Orders

## 2017-01-10 ENCOUNTER — Telehealth: Payer: Self-pay | Admitting: Obstetrics and Gynecology

## 2017-01-10 NOTE — Telephone Encounter (Signed)
Left message regarding upcoming appointment has been canceled and needs to be rescheduled. °

## 2017-03-25 DIAGNOSIS — Z23 Encounter for immunization: Secondary | ICD-10-CM | POA: Diagnosis not present

## 2017-04-08 DIAGNOSIS — Z23 Encounter for immunization: Secondary | ICD-10-CM | POA: Diagnosis not present

## 2017-04-08 DIAGNOSIS — Z5181 Encounter for therapeutic drug level monitoring: Secondary | ICD-10-CM | POA: Diagnosis not present

## 2017-04-08 DIAGNOSIS — E039 Hypothyroidism, unspecified: Secondary | ICD-10-CM | POA: Diagnosis not present

## 2017-04-08 DIAGNOSIS — E782 Mixed hyperlipidemia: Secondary | ICD-10-CM | POA: Diagnosis not present

## 2017-06-28 ENCOUNTER — Ambulatory Visit: Payer: BLUE CROSS/BLUE SHIELD | Admitting: Nurse Practitioner

## 2017-07-05 ENCOUNTER — Other Ambulatory Visit: Payer: Self-pay

## 2017-07-05 ENCOUNTER — Encounter: Payer: Self-pay | Admitting: Obstetrics & Gynecology

## 2017-07-05 ENCOUNTER — Ambulatory Visit: Payer: BLUE CROSS/BLUE SHIELD | Admitting: Obstetrics & Gynecology

## 2017-07-05 VITALS — BP 160/98 | HR 112 | Resp 16 | Ht 61.25 in | Wt 155.0 lb

## 2017-07-05 DIAGNOSIS — Z01419 Encounter for gynecological examination (general) (routine) without abnormal findings: Secondary | ICD-10-CM | POA: Diagnosis not present

## 2017-07-05 DIAGNOSIS — Z0000 Encounter for general adult medical examination without abnormal findings: Secondary | ICD-10-CM

## 2017-07-05 NOTE — Progress Notes (Signed)
57 y.o. [id number] MarriedCaucasianF here for annual exam.  Denies vaginal bleeding.  Did get a flu shot.    H/O cystocele and rectocele.  If she is very careful with lifting, pushing, pulling, does not typically have symptoms.  Continues to not be interested in surgery at this time.  Finds this more "annoying" than anything.    PCP:  Shirlean Mylar.    Patient's last menstrual period was 04/16/2002 (exact date).          Sexually active: Yes.    The current method of family planning is post menopausal status.    Exercising: Yes.    biking, walking Smoker:  no  Health Maintenance: Pap:  06/18/15 Neg. HR HPV:neg  History of abnormal Pap:  no MMG:  07/16/15 BIRADS1:neg  Colonoscopy:  01/02/10, IFOB 08/31/16 negative  BMD:   07/16/15 Osteopenia  TDaP:  04/08/17 with PCP  Pneumonia vaccine(s):  no Shingrix:   No  Hep C testing: 06/18/15 Neg  Screening Labs: PCP, Hb today: discuss with provider, Urine today: not collected    reports that  has never smoked. she has never used smokeless tobacco. She reports that she does not drink alcohol or use drugs.  Past Medical History:  Diagnosis Date  . Hyperlipidemia   . Hypothyroid 03/2000  . Premature ovarian failure 08/2001  . STD (sexually transmitted disease)    HSV    Past Surgical History:  Procedure Laterality Date  . CESAREAN SECTION      Current Outpatient Medications  Medication Sig Dispense Refill  . ALPRAZolam (XANAX) 0.25 MG tablet Take 0.25 mg by mouth daily as needed for anxiety.   0  . atorvastatin (LIPITOR) 10 MG tablet Take 10 mg by mouth daily at 6 PM.     . CALCIUM PO Take 2,568 mg by mouth at bedtime. Supplement and dietary intake    . levothyroxine (SYNTHROID, LEVOTHROID) 75 MCG tablet Take 75 mcg by mouth daily before breakfast.     . Multiple Vitamin (MULTIVITAMIN) tablet Take 1 tablet by mouth at bedtime.     . NON FORMULARY Take 400 Units by mouth at bedtime. VITAMIN D. Supplement and dietary    . omeprazole (PRILOSEC) 40 MG  capsule Take 1 capsule by mouth daily.  1  . sodium chloride (OCEAN) 0.65 % SOLN nasal spray Place 1 spray into both nostrils at bedtime.    . valACYclovir (VALTREX) 1000 MG tablet take 1 tablet by mouth daily if needed 30 tablet 12   No current facility-administered medications for this visit.     Family History  Problem Relation Age of Onset  . Stroke Mother   . Rheum arthritis Mother   . Cancer Maternal Grandmother 88       ovarian    ROS:  Pertinent items are noted in HPI.  Otherwise, a comprehensive ROS was negative.  Exam:   BP (!) 160/98 (BP Location: Right Arm, Patient Position: Sitting, Cuff Size: Normal) Comment: patient anxious  Pulse (!) 112   Resp 16   Ht 5' 1.25" (1.556 m)   Wt 155 lb (70.3 kg)   LMP 04/16/2002 (Exact Date)   BMI 29.05 kg/m   Height: 5' 1.25" (155.6 cm)  Ht Readings from Last 3 Encounters:  07/05/17 5' 1.25" (1.556 m)  06/21/16 5' 1.25" (1.556 m)  06/18/15 5\' 1"  (1.549 m)    General appearance: alert, cooperative and appears stated age Head: Normocephalic, without obvious abnormality, atraumatic Neck: no adenopathy, supple, symmetrical, trachea midline  and thyroid normal to inspection and palpation Lungs: clear to auscultation bilaterally Breasts: normal appearance, no masses or tenderness Heart: regular rate and rhythm Abdomen: soft, non-tender; bowel sounds normal; no masses,  no organomegaly Extremities: extremities normal, atraumatic, no cyanosis or edema Skin: Skin color, texture, turgor normal. No rashes or lesions Lymph nodes: Cervical, supraclavicular, and axillary nodes normal. No abnormal inguinal nodes palpated Neurologic: Grossly normal   Pelvic: External genitalia:  no lesions              Urethra:  normal appearing urethra with no masses, tenderness or lesions              Bartholins and Skenes: normal                 Vagina: normal appearing vagina with normal color and discharge, no lesions              Cervix: no  lesions              Pap taken: No. Bimanual Exam:  Uterus:  normal size, contour, position, consistency, mobility, non-tender              Adnexa: normal adnexa and no mass, fullness, tenderness               Rectovaginal: Confirms               Anus:  normal sphincter tone, no lesions  Chaperone was present for exam.  A:  Well Woman with normal exam PMP, no HRT H/O menopause age 57 Cystocele and rectocele Hypothyroidism Elevated lipids Osteopenia H/O HSV White coat hypertension--BP was rechecked today and was 160/90.  Pt checks at home and typically runs 110's - 120's  P:   Mammogram guidelines reviewed pap smear and HR HPV obtained 1/17.  Will repeat in 1 year. Declines PT or any other treatment for pessary Desires CBC today Return annually or prn

## 2017-07-06 LAB — CBC
Hematocrit: 40.3 % (ref 34.0–46.6)
Hemoglobin: 13.8 g/dL (ref 11.1–15.9)
MCH: 31.4 pg (ref 26.6–33.0)
MCHC: 34.2 g/dL (ref 31.5–35.7)
MCV: 92 fL (ref 79–97)
Platelets: 240 10*3/uL (ref 150–379)
RBC: 4.39 x10E6/uL (ref 3.77–5.28)
RDW: 13.9 % (ref 12.3–15.4)
WBC: 4.9 10*3/uL (ref 3.4–10.8)

## 2017-08-16 DIAGNOSIS — Z8669 Personal history of other diseases of the nervous system and sense organs: Secondary | ICD-10-CM | POA: Diagnosis not present

## 2017-08-26 DIAGNOSIS — H8101 Meniere's disease, right ear: Secondary | ICD-10-CM | POA: Diagnosis not present

## 2017-08-26 DIAGNOSIS — R42 Dizziness and giddiness: Secondary | ICD-10-CM | POA: Diagnosis not present

## 2017-08-26 DIAGNOSIS — H903 Sensorineural hearing loss, bilateral: Secondary | ICD-10-CM | POA: Diagnosis not present

## 2017-09-06 DIAGNOSIS — R42 Dizziness and giddiness: Secondary | ICD-10-CM | POA: Diagnosis not present

## 2017-09-06 DIAGNOSIS — H903 Sensorineural hearing loss, bilateral: Secondary | ICD-10-CM | POA: Diagnosis not present

## 2017-09-06 DIAGNOSIS — H8101 Meniere's disease, right ear: Secondary | ICD-10-CM | POA: Diagnosis not present

## 2018-01-11 DIAGNOSIS — H903 Sensorineural hearing loss, bilateral: Secondary | ICD-10-CM | POA: Diagnosis not present

## 2018-01-11 DIAGNOSIS — H8101 Meniere's disease, right ear: Secondary | ICD-10-CM | POA: Diagnosis not present

## 2018-02-27 ENCOUNTER — Other Ambulatory Visit: Payer: Self-pay | Admitting: Nurse Practitioner

## 2018-02-27 DIAGNOSIS — Z1231 Encounter for screening mammogram for malignant neoplasm of breast: Secondary | ICD-10-CM

## 2018-03-28 ENCOUNTER — Ambulatory Visit: Payer: BLUE CROSS/BLUE SHIELD

## 2018-06-05 DIAGNOSIS — E782 Mixed hyperlipidemia: Secondary | ICD-10-CM | POA: Diagnosis not present

## 2018-06-05 DIAGNOSIS — I10 Essential (primary) hypertension: Secondary | ICD-10-CM | POA: Diagnosis not present

## 2018-06-05 DIAGNOSIS — H8103 Meniere's disease, bilateral: Secondary | ICD-10-CM | POA: Diagnosis not present

## 2018-06-05 DIAGNOSIS — E039 Hypothyroidism, unspecified: Secondary | ICD-10-CM | POA: Diagnosis not present

## 2018-08-15 ENCOUNTER — Other Ambulatory Visit: Payer: Self-pay | Admitting: Obstetrics & Gynecology

## 2018-08-15 DIAGNOSIS — Z1231 Encounter for screening mammogram for malignant neoplasm of breast: Secondary | ICD-10-CM

## 2018-09-14 ENCOUNTER — Ambulatory Visit: Payer: BLUE CROSS/BLUE SHIELD | Admitting: Obstetrics & Gynecology

## 2018-09-18 ENCOUNTER — Ambulatory Visit: Payer: BLUE CROSS/BLUE SHIELD | Admitting: Obstetrics & Gynecology

## 2018-12-23 DIAGNOSIS — Z1231 Encounter for screening mammogram for malignant neoplasm of breast: Secondary | ICD-10-CM | POA: Diagnosis not present

## 2019-06-19 ENCOUNTER — Ambulatory Visit: Payer: BLUE CROSS/BLUE SHIELD | Admitting: Obstetrics & Gynecology

## 2019-08-02 ENCOUNTER — Ambulatory Visit: Payer: BLUE CROSS/BLUE SHIELD | Admitting: Obstetrics & Gynecology

## 2020-03-13 ENCOUNTER — Other Ambulatory Visit: Payer: Self-pay | Admitting: Obstetrics & Gynecology

## 2020-03-13 ENCOUNTER — Other Ambulatory Visit: Payer: Self-pay | Admitting: Physical Medicine and Rehabilitation

## 2020-03-13 DIAGNOSIS — Z1231 Encounter for screening mammogram for malignant neoplasm of breast: Secondary | ICD-10-CM

## 2020-03-27 ENCOUNTER — Telehealth: Payer: Self-pay

## 2020-03-27 NOTE — Telephone Encounter (Signed)
AEX 06/2017 with SM, next scheduled with KD 06/2020 H/o cystocele and rectocele  PMP, no HRT   Spoke with pt. Pt reports having issues with cystocele. Pt states did a lot of yard work on 10/18 and then after started voiding more, having urgency and frequency. Pt states hasn't been like this before. States also having dull ache around pubic bone. Denies any and all UTI sx, fever, chills, back pain. Per reviewed notes, pt had declined PT and pessary since last OV. Pt states doesn't know what to do now since  sx have gotten worse. Pt states does not leak when coughing, laughing or sneeze.   Pt advised to have OV for further evaluation. Pt agreeable. Pt scheduled with Dr Edward Jolly per pt's request saying she does not want to follow Dr Hyacinth Meeker. Pt OV 10/28 at 1030 am for consult and treatment of cystocele. Pt verbalized understanding to date and time of appt. Declines earlier offered appts.   Routing to Dr Edward Jolly for review.  Encounter closed.

## 2020-03-27 NOTE — Telephone Encounter (Signed)
Patient is calling in regards to having issues with cystocele.

## 2020-04-01 NOTE — Progress Notes (Signed)
GYNECOLOGY  VISIT   HPI: 59 y.o.   Married  Caucasian  female   G2P2002 with Patient's last menstrual period was 04/16/2002 (exact date).   here for urinary urgency and frequency. This began after working in the yard last week. She has history of cystocele and rectocele, seen by Dr. Sherron Monday in the past.   No pain with urination. From 10/18 - 10/24 she had frequency especially if she heard the sound of water.  She is doing body position change to void as much as possible.  DF - every 30 - 60 minutes.  NF - more than usual.  Symptoms today are almost back to normal.  No new dietary changes.  She has reflux so she is not usually consuming a lot of bladder irritants.   No hx of stones.  A couple of UTIs in her lifetime.   She is not having urinary leakage unless she is laughing.   She feels aching in her lower pelvis and a bulging of her rectum. Hx constipation.  No splinting.  No fecal incontinence.   Hx C/S and then VBAC with laceration.   Not having penetration, too painful. States she has atrophy and has chosen not to take HRT.  Urine: large amount of Ketones, 1+ RBC, trace protein  GYNECOLOGIC HISTORY: Patient's last menstrual period was 04/16/2002 (exact date). Contraception: PMP Menopausal hormone therapy:  none Last mammogram: 12-23-18 3D/Neg/BiRads1 -- scheduled 04/08/20 Last pap smear:  06-18-15 Neg:Neg HR HPV        OB History    Gravida  2   Para  2   Term  2   Preterm  0   AB  0   Living  2     SAB  0   TAB  0   Ectopic  0   Multiple  0   Live Births  2              Patient Active Problem List   Diagnosis Date Noted  . Cystocele 05/10/2014  . Rectocele 05/10/2014    Past Medical History:  Diagnosis Date  . Hyperlipidemia   . Hypothyroid 03/2000  . Premature ovarian failure 08/2001  . STD (sexually transmitted disease)    HSV    Past Surgical History:  Procedure Laterality Date  . CESAREAN SECTION      Current Outpatient  Medications  Medication Sig Dispense Refill  . atorvastatin (LIPITOR) 10 MG tablet Take 10 mg by mouth daily at 6 PM.     . CALCIUM PO Take 2,568 mg by mouth at bedtime. Supplement and dietary intake    . levothyroxine (SYNTHROID, LEVOTHROID) 75 MCG tablet Take 75 mcg by mouth daily before breakfast.     . Multiple Vitamin (MULTIVITAMIN) tablet Take 1 tablet by mouth at bedtime.     . NON FORMULARY Take 400 Units by mouth at bedtime. VITAMIN D. Supplement and dietary    . triamcinolone cream (KENALOG) 0.1 % Apply topically 2 (two) times daily.    . valACYclovir (VALTREX) 1000 MG tablet take 1 tablet by mouth daily if needed 30 tablet 12   No current facility-administered medications for this visit.     ALLERGIES: Amoxicillin and Azithromycin  Family History  Problem Relation Age of Onset  . Stroke Mother   . Rheum arthritis Mother   . Cancer Maternal Grandmother 58       ovarian    Social History   Socioeconomic History  . Marital status: Married  Spouse name: Not on file  . Number of children: Not on file  . Years of education: Not on file  . Highest education level: Not on file  Occupational History  . Not on file  Tobacco Use  . Smoking status: Never Smoker  . Smokeless tobacco: Never Used  Substance and Sexual Activity  . Alcohol use: No  . Drug use: No  . Sexual activity: Yes    Partners: Male    Birth control/protection: Surgical    Comment: vasectomy  Other Topics Concern  . Not on file  Social History Narrative  . Not on file   Social Determinants of Health   Financial Resource Strain:   . Difficulty of Paying Living Expenses: Not on file  Food Insecurity:   . Worried About Programme researcher, broadcasting/film/video in the Last Year: Not on file  . Ran Out of Food in the Last Year: Not on file  Transportation Needs:   . Lack of Transportation (Medical): Not on file  . Lack of Transportation (Non-Medical): Not on file  Physical Activity:   . Days of Exercise per Week: Not  on file  . Minutes of Exercise per Session: Not on file  Stress:   . Feeling of Stress : Not on file  Social Connections:   . Frequency of Communication with Friends and Family: Not on file  . Frequency of Social Gatherings with Friends and Family: Not on file  . Attends Religious Services: Not on file  . Active Member of Clubs or Organizations: Not on file  . Attends Banker Meetings: Not on file  . Marital Status: Not on file  Intimate Partner Violence:   . Fear of Current or Ex-Partner: Not on file  . Emotionally Abused: Not on file  . Physically Abused: Not on file  . Sexually Abused: Not on file    Review of Systems  Constitutional: Negative.   HENT: Negative.   Eyes: Negative.   Respiratory: Negative.   Cardiovascular: Negative.   Gastrointestinal: Negative.   Endocrine: Negative.   Genitourinary: Positive for frequency and urgency.  Musculoskeletal: Negative.   Skin: Negative.   Allergic/Immunologic: Negative.   Neurological: Negative.   Hematological: Negative.   Psychiatric/Behavioral: Negative.     PHYSICAL EXAMINATION:    BP (!) 152/70 (BP Location: Right Arm, Patient Position: Sitting, Cuff Size: Normal)   Pulse 92   Resp 14   Ht 5\' 1"  (1.549 m)   Wt 129 lb (58.5 kg)   LMP 04/16/2002 (Exact Date)   BMI 24.37 kg/m     General appearance: alert, cooperative and appears stated age  Abdomen: soft, non-tender, no masses,  no organomegaly No abnormal inguinal nodes palpated Neurologic: Grossly normal  Pelvic: External genitalia:  no lesions              Urethra:  normal appearing urethra with no masses, tenderness or lesions              Bartholins and Skenes: normal                 Vagina: normal appearing vagina with normal color and discharge, no lesions. Just to second degree cystocele and rectocele with valsalva.  Good uterine support.              Cervix: no lesions                Bimanual Exam:  Uterus:  normal size, contour,  position, consistency, mobility, non-tender  Adnexa: no mass, fullness, tenderness              Rectal exam: Yes.  .  Confirms.              Anus:  normal sphincter tone, no lesions  Chaperone was present for exam.  ASSESSMENT  Urinary urgency. Abnormal urine dip. Cystocele.  Rectocele.  PLAN  Will send urine for micro and cx. No antibiotics at this time.  We reviewed risk factors for the prolapse - lifting, straining.  We discussed PT, pessary, surgical care and observation for her prolapse and urinary urgency. She will let me know if she would like to proceed with PT. She would prefer to have care through [facility].  30 minutes total time was spent for this patient encounter, including preparation, face-to-face counseling with the patient, coordination of care, and documentation of the encounter.

## 2020-04-03 ENCOUNTER — Ambulatory Visit (INDEPENDENT_AMBULATORY_CARE_PROVIDER_SITE_OTHER): Payer: 59 | Admitting: Obstetrics and Gynecology

## 2020-04-03 ENCOUNTER — Encounter: Payer: Self-pay | Admitting: Obstetrics and Gynecology

## 2020-04-03 ENCOUNTER — Other Ambulatory Visit: Payer: Self-pay

## 2020-04-03 VITALS — BP 152/70 | HR 92 | Resp 14 | Ht 61.0 in | Wt 129.0 lb

## 2020-04-03 DIAGNOSIS — N816 Rectocele: Secondary | ICD-10-CM

## 2020-04-03 DIAGNOSIS — R3915 Urgency of urination: Secondary | ICD-10-CM

## 2020-04-03 DIAGNOSIS — N8110 Cystocele, unspecified: Secondary | ICD-10-CM

## 2020-04-03 LAB — POCT URINALYSIS DIPSTICK
Bilirubin, UA: NEGATIVE
Glucose, UA: NEGATIVE
Leukocytes, UA: NEGATIVE
Nitrite, UA: NEGATIVE
Protein, UA: POSITIVE — AB
Urobilinogen, UA: 0.2 E.U./dL
pH, UA: 5 (ref 5.0–8.0)

## 2020-04-04 LAB — URINALYSIS, MICROSCOPIC ONLY
Bacteria, UA: NONE SEEN
Casts: NONE SEEN /lpf

## 2020-04-05 LAB — URINE CULTURE

## 2020-04-08 ENCOUNTER — Ambulatory Visit
Admission: RE | Admit: 2020-04-08 | Discharge: 2020-04-08 | Disposition: A | Discharge status: Home / Self Care | Payer: PRIVATE HEALTH INSURANCE | Source: Ambulatory Visit | Attending: Obstetrics & Gynecology | Admitting: Obstetrics & Gynecology

## 2020-04-08 ENCOUNTER — Other Ambulatory Visit: Payer: Self-pay

## 2020-04-08 DIAGNOSIS — Z1231 Encounter for screening mammogram for malignant neoplasm of breast: Secondary | ICD-10-CM

## 2020-07-01 NOTE — Progress Notes (Signed)
60 y.o. G42P2002 Married White or Caucasian female here for annual exam.    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 Keeps brain active with many activities Likes to swim and bike in summer  Discussed risks for bone loss: early menopause, use of synthroid, will schedule DEXA  Patient's last menstrual period was 04/16/2002 (exact date).          Sexually active: Yes.    The current method of family planning is post menopausal status.    Exercising: Yes.    Home exercise routine includes biking and swimming. Smoker:  no  Health Maintenance: Pap:  06-18-15 neg HPV HR neg History of abnormal Pap:  no MMG:  04-08-2020 category b density birads 1:neg Colonoscopy:  01-02-10, ifob neg 2018; planning to have done this summer BMD:   2017 osteopenia TDaP:  2018 with pcp Gardasil:   none Covid-19: pfizer x 2 doses and booster Hep C testing: neg 2017 Screening Labs: with PCP   reports that she has never smoked. She has never used smokeless tobacco. She reports that she does not drink alcohol and does not use drugs.  Past Medical History:  Diagnosis Date  . Hyperlipidemia   . Hypothyroid 03/2000  . Premature ovarian failure 08/2001  . STD (sexually transmitted disease)    HSV    Past Surgical History:  Procedure Laterality Date  . CESAREAN SECTION      Current Outpatient Medications  Medication Sig Dispense Refill  . atorvastatin (LIPITOR) 10 MG tablet Take 10 mg by mouth daily at 6 PM.     . CALCIUM PO Take 2,568 mg by mouth at bedtime. Supplement and dietary intake    . levothyroxine (SYNTHROID, LEVOTHROID) 75 MCG tablet Take 75 mcg by mouth daily before breakfast.     . Multiple Vitamin (MULTIVITAMIN) tablet Take 1 tablet by mouth at bedtime.     . NON FORMULARY Take 400 Units by mouth at bedtime. VITAMIN D. Supplement and dietary    . triamcinolone cream (KENALOG) 0.1 % Apply topically 2 (two)  times daily.    . valACYclovir (VALTREX) 1000 MG tablet take 1 tablet by mouth daily if needed 30 tablet 12   No current facility-administered medications for this visit.    Family History  Problem Relation Age of Onset  . Stroke Mother   . Rheum arthritis Mother   . Cancer Maternal Grandmother 81       ovarian    Review of Systems  Constitutional: Negative.   HENT: Negative.   Eyes: Negative.   Respiratory: Negative.   Cardiovascular: Negative.   Gastrointestinal: Negative.   Endocrine: Negative.   Genitourinary: Negative.   Musculoskeletal: Negative.   Skin: Negative.     Exam:   BP (!) 168/94   Pulse 98   Resp 17   Ht 5' 1.5" (1.562 m)   Wt 127 lb 6.4 oz (57.8 kg)   LMP 04/16/2002 (Exact Date)   BMI 23.68 kg/m   Height: 5' 1.5" (156.2 cm)   Recheck B/p 152/86  General appearance: alert, cooperative and appears stated age, no acute distress Head: Normocephalic, without obvious abnormality Neck: no adenopathy, thyroid normal to inspection and palpation Lungs: clear to auscultation bilaterally Breasts: normal appearance, no masses or tenderness Heart: regular rate and rhythm Abdomen: soft, non-tender; no masses,  no organomegaly Extremities: extremities normal, no edema Skin: No rashes or lesions Lymph nodes: Cervical, supraclavicular,  and axillary nodes normal. No abnormal inguinal nodes palpated Neurologic: Grossly normal   Pelvic: External genitalia:  no lesions              Urethra:  normal appearing urethra with no masses, tenderness or lesions              Bartholins and Skenes: normal                 Vagina: normal appearing vagina, appropriate for age, normal appearing discharge, no lesions (mild rectocele/cystocele)              Cervix: neg cervical motion tenderness, no visible lesions             Bimanual Exam:   Uterus:  normal size, contour, position, consistency, mobility, non-tender              Adnexa: no mass, fullness, tenderness                  Kim, RN, Chaperone was present for exam.  A:  Well Woman with normal exam  Well female exam with routine gynecological exam - Plan: Cytology - PAP( [place])  Osteopenia after menopause - Plan: DG Bone Density  Elevated blood-pressure reading without diagnosis of hypertension  Urinary issues (from 03/2020 visit) resolving with Kegel's and behavior changes   P:   Pap : co-test collected today  Mammogram:pt to schedule when due, due 04/2021  Labs:with PCP  Medications: no new today  DEXA: to schedule at check out  B/P, discussed elevated blood pressure, and risks. Pt states PCP is aware and watching. Encouraged patient to purchase home b/p monitor and call PCP if remains elevated.

## 2020-07-04 ENCOUNTER — Ambulatory Visit (INDEPENDENT_AMBULATORY_CARE_PROVIDER_SITE_OTHER): Payer: 59 | Admitting: Nurse Practitioner

## 2020-07-04 ENCOUNTER — Other Ambulatory Visit: Payer: Self-pay

## 2020-07-04 ENCOUNTER — Other Ambulatory Visit (HOSPITAL_COMMUNITY)
Admission: RE | Admit: 2020-07-04 | Discharge: 2020-07-04 | Disposition: A | Discharge status: Home / Self Care | Payer: 59 | Source: Ambulatory Visit | Attending: Obstetrics & Gynecology | Admitting: Obstetrics & Gynecology

## 2020-07-04 ENCOUNTER — Ambulatory Visit: Payer: BLUE CROSS/BLUE SHIELD | Admitting: Obstetrics & Gynecology

## 2020-07-04 ENCOUNTER — Encounter: Payer: Self-pay | Admitting: Nurse Practitioner

## 2020-07-04 VITALS — BP 168/94 | HR 98 | Resp 17 | Ht 61.5 in | Wt 127.4 lb

## 2020-07-04 DIAGNOSIS — Z780 Asymptomatic menopausal state: Secondary | ICD-10-CM | POA: Diagnosis not present

## 2020-07-04 DIAGNOSIS — Z01419 Encounter for gynecological examination (general) (routine) without abnormal findings: Secondary | ICD-10-CM

## 2020-07-04 DIAGNOSIS — M8580 Other specified disorders of bone density and structure, unspecified site: Secondary | ICD-10-CM | POA: Diagnosis not present

## 2020-07-04 DIAGNOSIS — R030 Elevated blood-pressure reading, without diagnosis of hypertension: Secondary | ICD-10-CM

## 2020-07-04 NOTE — Patient Instructions (Signed)
Health Maintenance for Postmenopausal Women Menopause is a normal process in which your ability to get pregnant comes to an end. This process happens slowly over many months or years, usually between the ages of 48 and 55. Menopause is complete when you have missed your menstrual periods for 12 months. It is important to talk with your health care provider about some of the most common conditions that affect women after menopause (postmenopausal women). These include heart disease, cancer, and bone loss (osteoporosis). Adopting a healthy lifestyle and getting preventive care can help to promote your health and wellness. The actions you take can also lower your chances of developing some of these common conditions. What should I know about menopause? During menopause, you may get a number of symptoms, such as:  Hot flashes. These can be moderate or severe.  Night sweats.  Decrease in sex drive.  Mood swings.  Headaches.  Tiredness.  Irritability.  Memory problems.  Insomnia. Choosing to treat or not to treat these symptoms is a decision that you make with your health care provider. Do I need hormone replacement therapy?  Hormone replacement therapy is effective in treating symptoms that are caused by menopause, such as hot flashes and night sweats.  Hormone replacement carries certain risks, especially as you become older. If you are thinking about using estrogen or estrogen with progestin, discuss the benefits and risks with your health care provider. What is my risk for heart disease and stroke? The risk of heart disease, heart attack, and stroke increases as you age. One of the causes may be a change in the body's hormones during menopause. This can affect how your body uses dietary fats, triglycerides, and cholesterol. Heart attack and stroke are medical emergencies. There are many things that you can do to help prevent heart disease and stroke. Watch your blood pressure  High  blood pressure causes heart disease and increases the risk of stroke. This is more likely to develop in people who have high blood pressure readings, are of African descent, or are overweight.  Have your blood pressure checked: ? Every 3-5 years if you are 18-39 years of age. ? Every year if you are 40 years old or older. Eat a healthy diet  Eat a diet that includes plenty of vegetables, fruits, low-fat dairy products, and lean protein.  Do not eat a lot of foods that are high in solid fats, added sugars, or sodium.   Get regular exercise Get regular exercise. This is one of the most important things you can do for your health. Most adults should:  Try to exercise for at least 150 minutes each week. The exercise should increase your heart rate and make you sweat (moderate-intensity exercise).  Try to do strengthening exercises at least twice each week. Do these in addition to the moderate-intensity exercise.  Spend less time sitting. Even light physical activity can be beneficial. Other tips  Work with your health care provider to achieve or maintain a healthy weight.  Do not use any products that contain nicotine or tobacco, such as cigarettes, e-cigarettes, and chewing tobacco. If you need help quitting, ask your health care provider.  Know your numbers. Ask your health care provider to check your cholesterol and your blood sugar (glucose). Continue to have your blood tested as directed by your health care provider. Do I need screening for cancer? Depending on your health history and family history, you may need to have cancer screening at different stages of your life.   This may include screening for:  Breast cancer.  Cervical cancer.  Lung cancer.  Colorectal cancer. What is my risk for osteoporosis? After menopause, you may be at increased risk for osteoporosis. Osteoporosis is a condition in which bone destruction happens more quickly than new bone creation. To help prevent  osteoporosis or the bone fractures that can happen because of osteoporosis, you may take the following actions:  If you are 22-1 years old, get at least 1,000 mg of calcium and at least 600 mg of vitamin D per day.  If you are older than age 71 but younger than age 31, get at least 1,200 mg of calcium and at least 600 mg of vitamin D per day.  If you are older than age 3, get at least 1,200 mg of calcium and at least 800 mg of vitamin D per day. Smoking and drinking excessive alcohol increase the risk of osteoporosis. Eat foods that are rich in calcium and vitamin D, and do weight-bearing exercises several times each week as directed by your health care provider. How does menopause affect my mental health? Depression may occur at any age, but it is more common as you become older. Common symptoms of depression include:  Low or sad mood.  Changes in sleep patterns.  Changes in appetite or eating patterns.  Feeling an overall lack of motivation or enjoyment of activities that you previously enjoyed.  Frequent crying spells. Talk with your health care provider if you think that you are experiencing depression. General instructions See your health care provider for regular wellness exams and vaccines. This may include:  Scheduling regular health, dental, and eye exams.  Getting and maintaining your vaccines. These include: ? Influenza vaccine. Get this vaccine each year before the flu season begins. ? Pneumonia vaccine. ? Shingles vaccine. ? Tetanus, diphtheria, and pertussis (Tdap) booster vaccine. Your health care provider may also recommend other immunizations. Tell your health care provider if you have ever been abused or do not feel safe at home. Summary  Menopause is a normal process in which your ability to get pregnant comes to an end.  This condition causes hot flashes, night sweats, decreased interest in sex, mood swings, headaches, or lack of sleep.  Treatment for this  condition may include hormone replacement therapy.  Take actions to keep yourself healthy, including exercising regularly, eating a healthy diet, watching your weight, and checking your blood pressure and blood sugar levels.  Get screened for cancer and depression. Make sure that you are up to date with all your vaccines. This information is not intended to replace advice given to you by your health care provider. Make sure you discuss any questions you have with your health care provider. Document Revised: 05/17/2018 Document Reviewed: 05/17/2018 Elsevier Patient Education  2021 [employer] Osteopenia  Osteopenia is a loss of thickness (density) inside the bones. Another name for osteopenia is low bone mass. Mild osteopenia is a normal part of aging. It is not a disease, and it does not cause symptoms. However, if you have osteopenia and continue to lose bone mass, you could develop a condition that causes the bones to become thin and break more easily (osteoporosis). Osteoporosis can cause you to lose some height, have back pain, and have a stooped posture. Although osteopenia is not a disease, making changes to your lifestyle and diet can help to prevent osteopenia from developing into osteoporosis. What are the causes? Osteopenia is caused by loss of calcium in the bones.  Bones are constantly changing. Old bone cells are continually being replaced with new bone cells. This process builds new bone. The mineral calcium is needed to build new bone and maintain bone density. Bone density is usually highest around age 46. After that, most people's bodies cannot replace all the bone they have lost with new bone. What increases the risk? You are more likely to develop this condition if:  You are older than age 63.  You are a woman who went through menopause early.  You have a long illness that keeps you in bed.  You do not get enough exercise.  You lack certain nutrients  (malnutrition).  You have an overactive thyroid gland (hyperthyroidism).  You use products that contain nicotine or tobacco, such as cigarettes, e-cigarettes and chewing tobacco, or you drink a lot of alcohol.  You are taking medicines that weaken the bones, such as steroids. What are the signs or symptoms? This condition does not cause any symptoms. You may have a slightly higher risk for bone breaks (fractures), so getting fractures more easily than normal may be an indication of osteopenia. How is this diagnosed? This condition may be diagnosed based on an X-ray exam that measures bone density (dual-energy X-ray absorptiometry, or DEXA). This test can measure bone density in your hips, spine, and wrists. Osteopenia has no symptoms, so this condition is usually diagnosed after a routine bone density screening test is done for osteoporosis. This routine screening is usually done for:  Women who are age 89 or older.  Men who are age 43 or older. If you have risk factors for osteopenia, you may have the screening test at an earlier age. How is this treated? Making dietary and lifestyle changes can lower your risk for osteoporosis. If you have severe osteopenia that is close to becoming osteoporosis, this condition can be treated with medicines and dietary supplements such as calcium and vitamin D. These supplements help to rebuild bone density. Follow these instructions at home: Eating and drinking Eat a diet that is high in calcium and vitamin D.  Calcium is found in dairy products, beans, salmon, and leafy green vegetables like spinach and broccoli.  Look for foods that have vitamin D and calcium added to them (fortified foods), such as orange juice, cereal, and bread.   Lifestyle  Do 30 minutes or more of a weight-bearing exercise every day, such as walking, jogging, or playing a sport. These types of exercises strengthen the bones.  Do not use any products that contain nicotine or  tobacco, such as cigarettes, e-cigarettes, and chewing tobacco. If you need help quitting, ask your health care provider.  Do not drink alcohol if: ? Your health care provider tells you not to drink. ? You are pregnant, may be pregnant, or are planning to become pregnant.  If you drink alcohol: ? Limit how much you use to:  0-1 drink a day for women.  0-2 drinks a day for men. ? Be aware of how much alcohol is in your drink. In the [place] one drink equals one 12 oz bottle of beer (355 mL), one 5 oz glass of wine (148 mL), or one 1 oz glass of hard liquor (44 mL). General instructions  Take over-the-counter and prescription medicines only as told by your health care provider. These include vitamins and supplements.  Take precautions at home to lower your risk of falling, such as: ? Keeping rooms well-lit and free of clutter, such as cords. ? Research scientist (life sciences)  rails on stairs. ? Using rubber mats in the bathroom or other areas that are often wet or slippery.  Keep all follow-up visits. This is important. Contact a health care provider if:  You have not had a bone density screening for osteoporosis and you are: ? A woman who is age 53 or older. ? A man who is age 63 or older.  You are a postmenopausal woman who has not had a bone density screening for osteoporosis.  You are older than age 81 and you want to know if you should have bone density screening for osteoporosis. Summary  Osteopenia is a loss of thickness (density) inside the bones. Another name for osteopenia is low bone mass.  Osteopenia is not a disease, but it may increase your risk for a condition that causes the bones to become thin and break more easily (osteoporosis).  You may be at risk for osteopenia if you are older than age 43 or if you are a woman who went through early menopause.  Osteopenia does not cause any symptoms, but it can be diagnosed with a bone density screening test.  Dietary and lifestyle  changes are the first treatment for osteopenia. These may lower your risk for osteoporosis. This information is not intended to replace advice given to you by your health care provider. Make sure you discuss any questions you have with your health care provider. Document Revised: 11/08/2019 Document Reviewed: 11/08/2019 Elsevier Patient Education  2021 [employer].

## 2020-07-07 LAB — CYTOLOGY - PAP
Comment: NEGATIVE
Diagnosis: NEGATIVE
High risk HPV: NEGATIVE

## 2020-09-02 ENCOUNTER — Ambulatory Visit (INDEPENDENT_AMBULATORY_CARE_PROVIDER_SITE_OTHER): Payer: 59

## 2020-09-02 ENCOUNTER — Other Ambulatory Visit: Payer: Self-pay | Admitting: Nurse Practitioner

## 2020-09-02 ENCOUNTER — Other Ambulatory Visit: Payer: Self-pay

## 2020-09-02 DIAGNOSIS — Z780 Asymptomatic menopausal state: Secondary | ICD-10-CM | POA: Diagnosis not present

## 2020-09-02 DIAGNOSIS — M8589 Other specified disorders of bone density and structure, multiple sites: Secondary | ICD-10-CM

## 2020-09-02 DIAGNOSIS — M8580 Other specified disorders of bone density and structure, unspecified site: Secondary | ICD-10-CM

## 2020-09-25 ENCOUNTER — Encounter: Payer: Self-pay | Admitting: Obstetrics and Gynecology

## 2021-03-24 ENCOUNTER — Other Ambulatory Visit: Payer: Self-pay | Admitting: Obstetrics & Gynecology

## 2021-03-24 ENCOUNTER — Other Ambulatory Visit: Payer: Self-pay | Admitting: Obstetrics and Gynecology

## 2021-03-24 DIAGNOSIS — Z1231 Encounter for screening mammogram for malignant neoplasm of breast: Secondary | ICD-10-CM

## 2021-04-23 ENCOUNTER — Other Ambulatory Visit: Payer: Self-pay

## 2021-04-23 ENCOUNTER — Ambulatory Visit
Admission: RE | Admit: 2021-04-23 | Discharge: 2021-04-23 | Disposition: A | Discharge status: Home / Self Care | Payer: 59 | Source: Ambulatory Visit | Attending: Obstetrics and Gynecology | Admitting: Obstetrics and Gynecology

## 2021-04-23 DIAGNOSIS — Z1231 Encounter for screening mammogram for malignant neoplasm of breast: Secondary | ICD-10-CM

## 2021-09-05 IMAGING — MG DIGITAL SCREENING BILAT W/ TOMO W/ CAD
8 series · 9 of 24 positions shown · non-contrast
Comparison: Previous exam(s).

CLINICAL DATA: Screening.

EXAM:
DIGITAL SCREENING BILATERAL MAMMOGRAM WITH TOMO AND CAD

[L CC synth-2D]
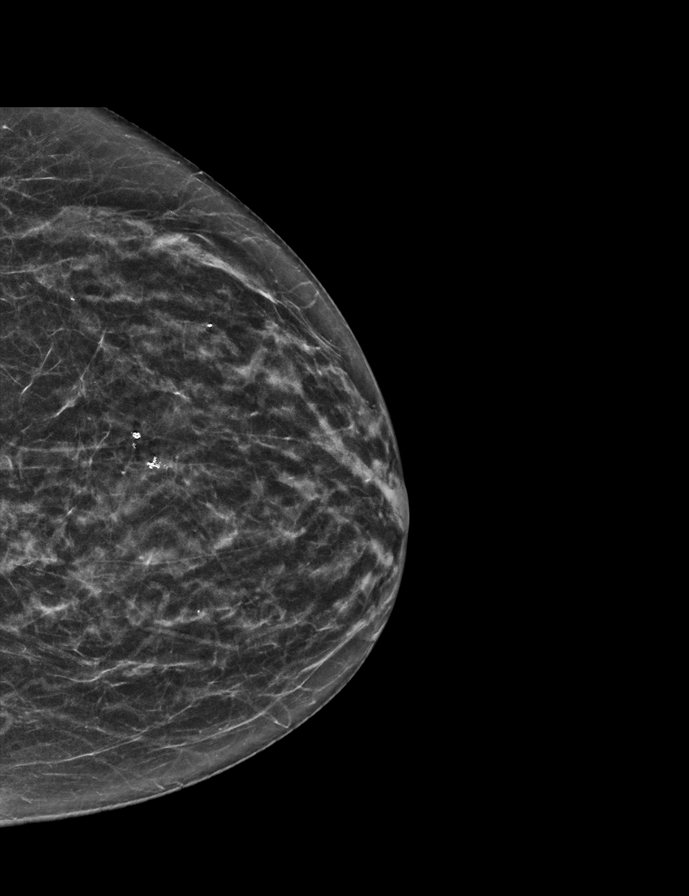

[R MLO synth-2D]
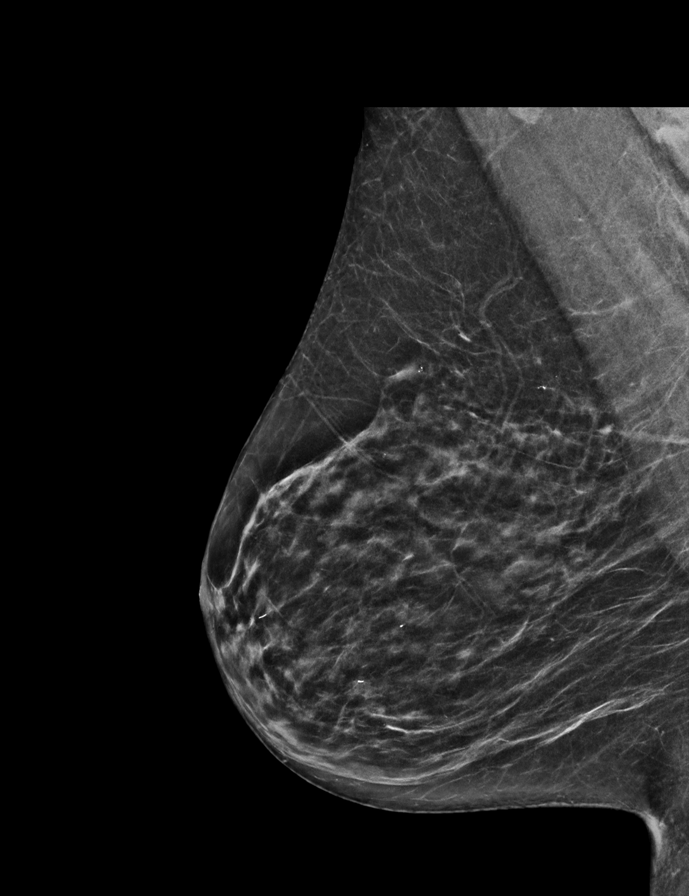

[L MLO synth-2D]
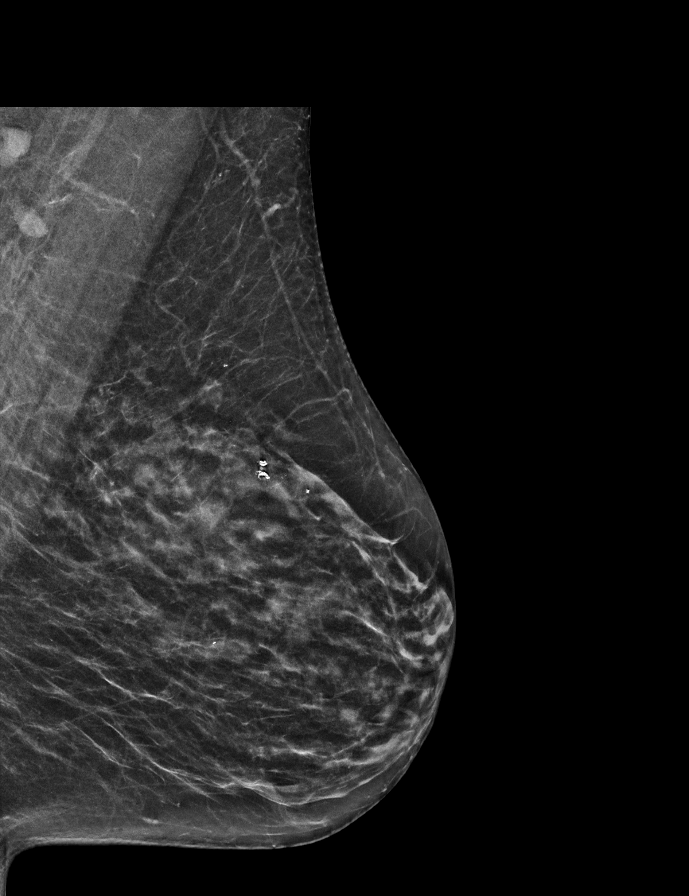

[R CC synth-2D]
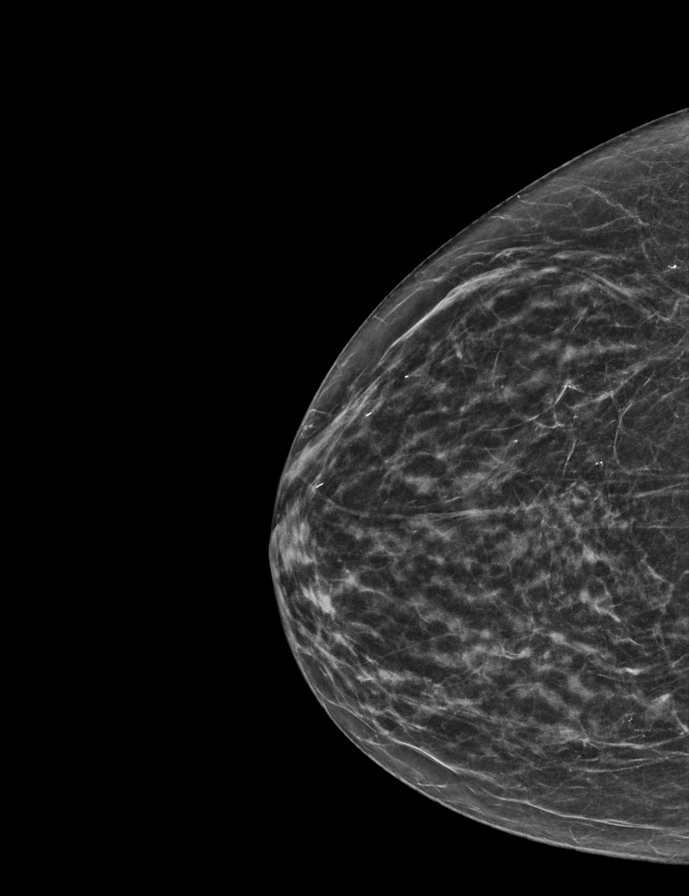

[L CC tomo · 2 of 47 frames shown]
[frame 16/47]
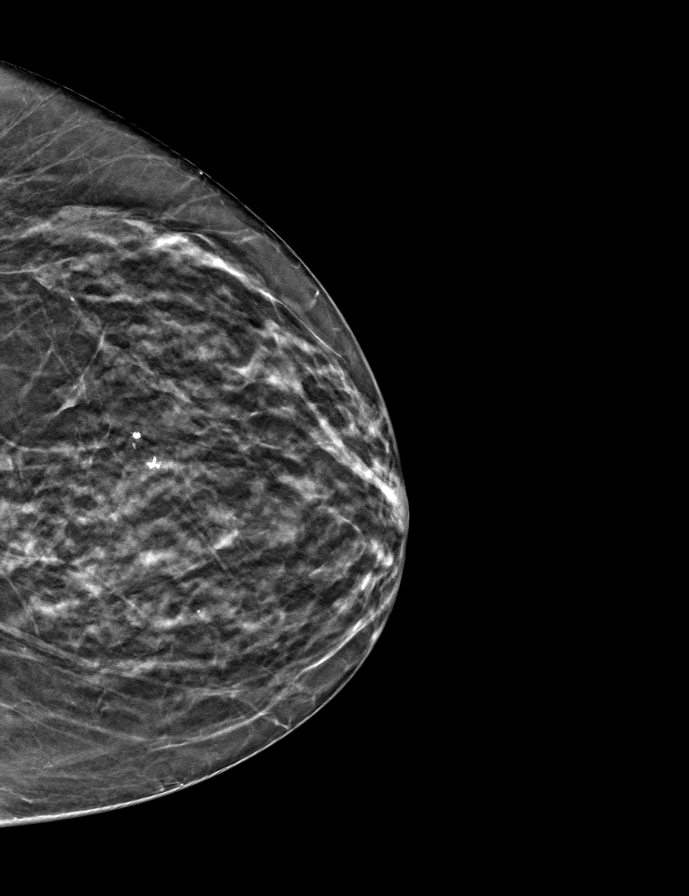
[frame 24/47]
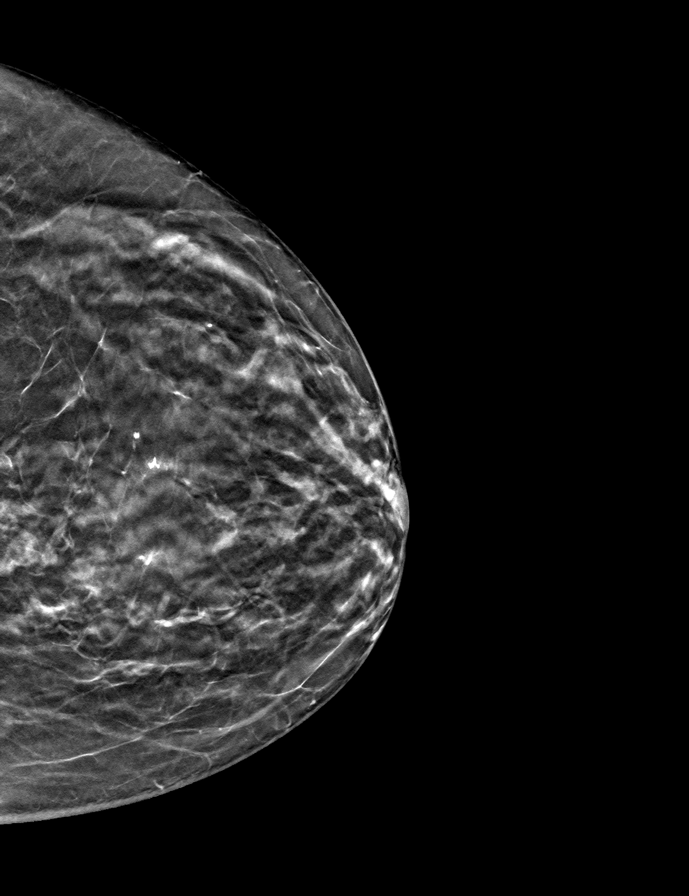

[R CC tomo · tomo slice 25/48.0]
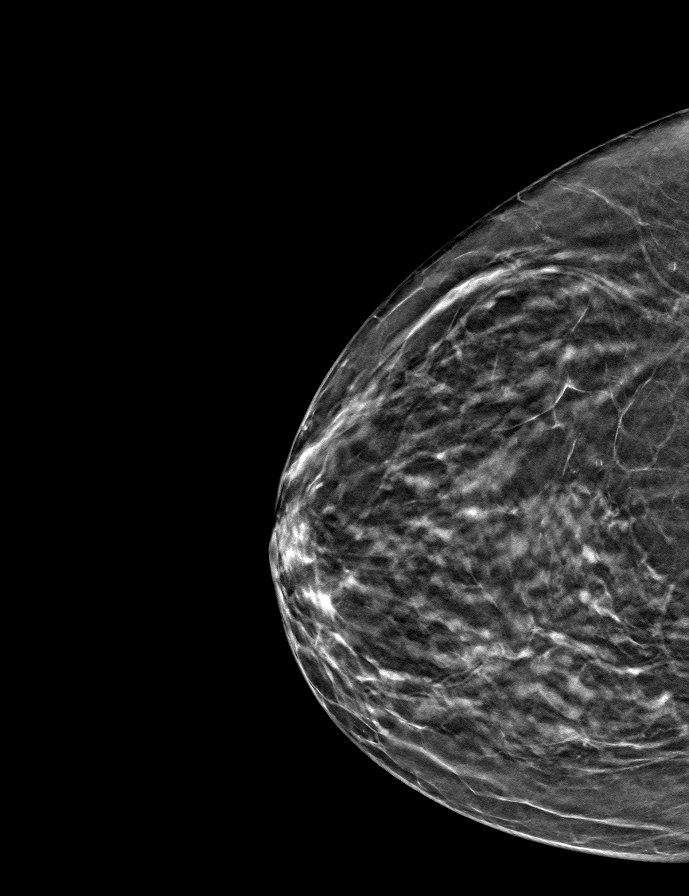

[L MLO tomo · tomo slice 27/53.0]
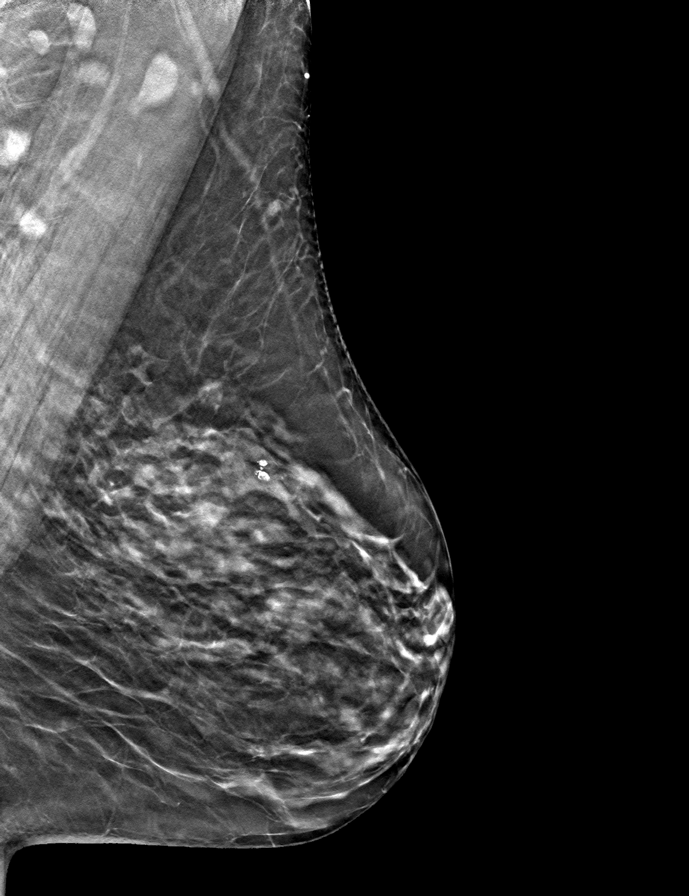

[R MLO tomo · tomo slice 29/57.0]
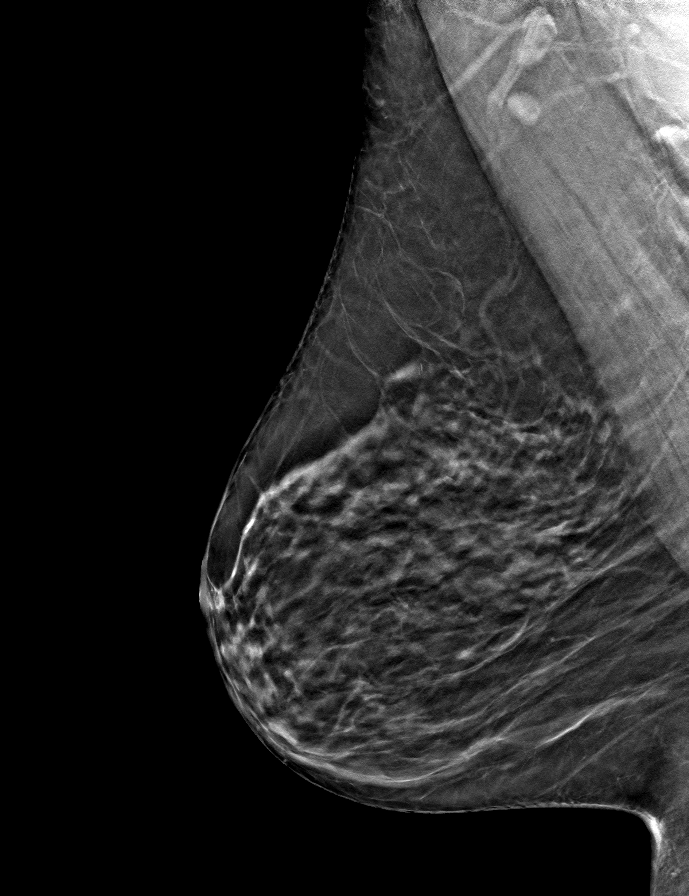

[9 of 24 positions shown; findings below may reference images not displayed]

ACR Breast Density Category b: There are scattered areas of
fibroglandular density.
FINDINGS: There are no findings suspicious for malignancy. Images were
processed with CAD.
IMPRESSION: No mammographic evidence of malignancy. A result letter of this
screening mammogram will be mailed directly to the patient.

RECOMMENDATION:
Screening mammogram in one year. (Code:[id number])

BI-RADS CATEGORY  1: Negative.

## 2022-06-22 DIAGNOSIS — H43813 Vitreous degeneration, bilateral: Secondary | ICD-10-CM | POA: Diagnosis not present

## 2022-07-07 DIAGNOSIS — E039 Hypothyroidism, unspecified: Secondary | ICD-10-CM | POA: Diagnosis not present

## 2022-07-20 DIAGNOSIS — H43812 Vitreous degeneration, left eye: Secondary | ICD-10-CM | POA: Diagnosis not present

## 2022-09-21 DIAGNOSIS — L821 Other seborrheic keratosis: Secondary | ICD-10-CM | POA: Diagnosis not present

## 2022-09-21 DIAGNOSIS — L905 Scar conditions and fibrosis of skin: Secondary | ICD-10-CM | POA: Diagnosis not present

## 2022-09-21 DIAGNOSIS — D2362 Other benign neoplasm of skin of left upper limb, including shoulder: Secondary | ICD-10-CM | POA: Diagnosis not present

## 2022-09-21 DIAGNOSIS — D2262 Melanocytic nevi of left upper limb, including shoulder: Secondary | ICD-10-CM | POA: Diagnosis not present

## 2022-09-21 DIAGNOSIS — D1801 Hemangioma of skin and subcutaneous tissue: Secondary | ICD-10-CM | POA: Diagnosis not present

## 2022-09-21 DIAGNOSIS — D224 Melanocytic nevi of scalp and neck: Secondary | ICD-10-CM | POA: Diagnosis not present

## 2022-09-21 DIAGNOSIS — D225 Melanocytic nevi of trunk: Secondary | ICD-10-CM | POA: Diagnosis not present

## 2022-10-19 DIAGNOSIS — R011 Cardiac murmur, unspecified: Secondary | ICD-10-CM | POA: Diagnosis not present

## 2022-10-19 DIAGNOSIS — R0982 Postnasal drip: Secondary | ICD-10-CM | POA: Diagnosis not present

## 2022-10-19 DIAGNOSIS — I10 Essential (primary) hypertension: Secondary | ICD-10-CM | POA: Diagnosis not present

## 2022-11-09 DIAGNOSIS — J342 Deviated nasal septum: Secondary | ICD-10-CM | POA: Diagnosis not present

## 2022-11-09 DIAGNOSIS — J343 Hypertrophy of nasal turbinates: Secondary | ICD-10-CM | POA: Diagnosis not present

## 2022-11-09 DIAGNOSIS — J310 Chronic rhinitis: Secondary | ICD-10-CM | POA: Diagnosis not present

## 2022-11-09 DIAGNOSIS — R0982 Postnasal drip: Secondary | ICD-10-CM | POA: Diagnosis not present

## 2022-12-21 ENCOUNTER — Other Ambulatory Visit: Payer: Self-pay | Admitting: Family Medicine

## 2022-12-21 DIAGNOSIS — E782 Mixed hyperlipidemia: Secondary | ICD-10-CM | POA: Diagnosis not present

## 2022-12-21 DIAGNOSIS — Z1231 Encounter for screening mammogram for malignant neoplasm of breast: Secondary | ICD-10-CM

## 2022-12-21 DIAGNOSIS — I10 Essential (primary) hypertension: Secondary | ICD-10-CM | POA: Diagnosis not present

## 2023-01-06 ENCOUNTER — Ambulatory Visit
Admission: RE | Admit: 2023-01-06 | Discharge: 2023-01-06 | Disposition: A | Discharge status: Home / Self Care | Payer: 59 | Source: Ambulatory Visit | Attending: Family Medicine | Admitting: Family Medicine

## 2023-01-06 DIAGNOSIS — Z1231 Encounter for screening mammogram for malignant neoplasm of breast: Secondary | ICD-10-CM | POA: Diagnosis not present

## 2023-03-17 DIAGNOSIS — Z23 Encounter for immunization: Secondary | ICD-10-CM | POA: Diagnosis not present

## 2023-03-17 DIAGNOSIS — E039 Hypothyroidism, unspecified: Secondary | ICD-10-CM | POA: Diagnosis not present

## 2023-05-03 DIAGNOSIS — Z79899 Other long term (current) drug therapy: Secondary | ICD-10-CM | POA: Diagnosis not present

## 2023-06-21 DIAGNOSIS — I10 Essential (primary) hypertension: Secondary | ICD-10-CM | POA: Diagnosis not present

## 2023-06-21 DIAGNOSIS — E039 Hypothyroidism, unspecified: Secondary | ICD-10-CM | POA: Diagnosis not present

## 2023-06-21 DIAGNOSIS — D708 Other neutropenia: Secondary | ICD-10-CM | POA: Diagnosis not present

## 2023-09-30 DIAGNOSIS — D709 Neutropenia, unspecified: Secondary | ICD-10-CM | POA: Diagnosis not present

## 2023-09-30 DIAGNOSIS — Z23 Encounter for immunization: Secondary | ICD-10-CM | POA: Diagnosis not present

## 2023-10-07 DIAGNOSIS — D709 Neutropenia, unspecified: Secondary | ICD-10-CM | POA: Diagnosis not present

## 2023-11-09 ENCOUNTER — Inpatient Hospital Stay: Attending: Hematology and Oncology | Admitting: Hematology and Oncology

## 2023-11-09 ENCOUNTER — Inpatient Hospital Stay

## 2023-11-09 VITALS — BP 170/94 | HR 101 | Temp 98.6°F | Resp 18 | Wt 128.1 lb

## 2023-11-09 DIAGNOSIS — D72810 Lymphocytopenia: Secondary | ICD-10-CM | POA: Insufficient documentation

## 2023-11-09 DIAGNOSIS — D72819 Decreased white blood cell count, unspecified: Secondary | ICD-10-CM

## 2023-11-09 DIAGNOSIS — Z79899 Other long term (current) drug therapy: Secondary | ICD-10-CM | POA: Insufficient documentation

## 2023-11-09 LAB — CBC WITH DIFFERENTIAL (CANCER CENTER ONLY)
Abs Immature Granulocytes: 0.01 10*3/uL (ref 0.00–0.07)
Basophils Absolute: 0 10*3/uL (ref 0.0–0.1)
Basophils Relative: 0 %
Eosinophils Absolute: 0 10*3/uL (ref 0.0–0.5)
Eosinophils Relative: 1 %
HCT: 40.8 % (ref 36.0–46.0)
Hemoglobin: 14.5 g/dL (ref 12.0–15.0)
Immature Granulocytes: 0 %
Lymphocytes Relative: 17 %
Lymphs Abs: 0.7 10*3/uL (ref 0.7–4.0)
MCH: 32.8 pg (ref 26.0–34.0)
MCHC: 35.5 g/dL (ref 30.0–36.0)
MCV: 92.3 fL (ref 80.0–100.0)
Monocytes Absolute: 0.2 10*3/uL (ref 0.1–1.0)
Monocytes Relative: 5 %
Neutro Abs: 3 10*3/uL (ref 1.7–7.7)
Neutrophils Relative %: 77 %
Platelet Count: 210 10*3/uL (ref 150–400)
RBC: 4.42 MIL/uL (ref 3.87–5.11)
RDW: 11.7 % (ref 11.5–15.5)
WBC Count: 4 10*3/uL (ref 4.0–10.5)
nRBC: 0 % (ref 0.0–0.2)

## 2023-11-09 NOTE — Progress Notes (Signed)
  [facility] CONSULT NOTE  Patient Care Team: Sylvester Evert, MD as PCP - General (Family Medicine)  CHIEF COMPLAINTS/PURPOSE OF CONSULTATION: Lymphopenia   HISTORY OF PRESENTING ILLNESS:   History of Present Illness Michelle Grimes is a 63 year old female who presents with persistent lymphopenia. She was referred by her primary care nurse practitioner for evaluation of her low white blood cell count.  Lymphopenia has been present since at least 2015, with consistently low white blood cell counts confirmed in past records. She has not experienced serious infections, including COVID-19, and has no history of chemotherapy or autoimmune conditions.  Family history includes rheumatoid arthritis in her mother and lupus in her brother's son. She manages flare-ups with Valtrex  as needed and takes lysine at night, avoiding trigger foods like chocolate and peanut butter.     I reviewed her records extensively and collaborated the history with the patient.  SUMMARY OF ONCOLOGIC HISTORY: Oncology History   No history exists.     MEDICAL HISTORY:  Past Medical History:  Diagnosis Date   Hyperlipidemia    Hypothyroid 03/2000   Premature ovarian failure 08/2001   STD (sexually transmitted disease)    HSV    SURGICAL HISTORY: Past Surgical History:  Procedure Laterality Date   CESAREAN SECTION      SOCIAL HISTORY: Social History   Socioeconomic History   Marital status: Married    Spouse name: Not on file   Number of children: Not on file   Years of education: Not on file   Highest education level: Not on file  Occupational History   Not on file  Tobacco Use   Smoking status: Never   Smokeless tobacco: Never  Vaping Use   Vaping status: Never Used  Substance and Sexual Activity   Alcohol use: No   Drug use: No   Sexual activity: Yes    Partners: Male    Birth control/protection: Surgical, Post-menopausal    Comment: vasectomy  Other Topics Concern   Not  on file  Social History Narrative   Not on file   Social Drivers of Health   Financial Resource Strain: Not on file  Food Insecurity: Not on file  Transportation Needs: Not on file  Physical Activity: Not on file  Stress: Not on file  Social Connections: Not on file  Intimate Partner Violence: Not on file    FAMILY HISTORY: Family History  Problem Relation Age of Onset   Stroke Mother    Rheum arthritis Mother    Cancer Maternal Grandmother 17       ovarian    ALLERGIES:  is allergic to amoxicillin and azithromycin.  MEDICATIONS:  Current Outpatient Medications  Medication Sig Dispense Refill   atorvastatin (LIPITOR) 10 MG tablet Take 10 mg by mouth daily at 6 PM.      CALCIUM PO Take 2,568 mg by mouth at bedtime. Supplement and dietary intake     levothyroxine (SYNTHROID, LEVOTHROID) 75 MCG tablet Take 75 mcg by mouth daily before breakfast.      loratadine (CLARITIN) 10 MG tablet Take 10 mg by mouth daily.     Multiple Vitamin (MULTIVITAMIN) tablet Take 1 tablet by mouth at bedtime.      NON FORMULARY Take 400 Units by mouth at bedtime. VITAMIN D. Supplement and dietary     triamcinolone cream (KENALOG) 0.1 % Apply topically 2 (two) times daily.     valACYclovir  (VALTREX ) 1000 MG tablet take 1 tablet by mouth daily if  needed 30 tablet 12   No current facility-administered medications for this visit.    REVIEW OF SYSTEMS:   Constitutional: Denies fevers, chills or abnormal night sweats   All other systems were reviewed with the patient and are negative.  PHYSICAL EXAMINATION: ECOG PERFORMANCE STATUS: 0 - Asymptomatic  Vitals:   11/09/23 1237  BP: (!) 170/94  Pulse: (!) 101  Resp: 18  Temp: 98.6 F (37 C)  SpO2: 98%   Filed Weights   11/09/23 1237  Weight: 128 lb 1.6 oz (58.1 kg)    GENERAL:alert, no distress and comfortable    LABORATORY DATA:  I have reviewed the data as listed Lab Results  Component Value Date   WBC 4.9 07/05/2017   HGB 13.8  07/05/2017   HCT 40.3 07/05/2017   MCV 92 07/05/2017   PLT 240 07/05/2017  .cbc    RADIOGRAPHIC STUDIES: I have personally reviewed the radiological reports and agreed with the findings in the report.  ASSESSMENT AND PLAN:  Leukopenia Lab review: 12/21/2022: WBC 3.1, hemoglobin 13.2, platelets 243 07/01/2023: WBC 3.5, hemoglobin 14.2, ANC 2.5, ALC 0.8, AMC 0.2, CMP: Normal 09/30/2023: WBC 3, hemoglobin 13.5, platelets 227, ANC 1.9, ALC 0.8, AMC 0.2 11/09/2023: WBC 4.9, hemoglobin 13.8, platelets 240, ANC 3, ALC 0.7  Mild leukopenia especially lymphopenia: Differential diagnosis: Medications: Patient takes Valtrex  Illnesses/infections: Viruses: Patient has had multiple herpes zoster flareups Autoimmune conditions like lupus: Will obtain ANA Bone marrow conditions Nutrition causes like zinc deficiency Ethnicity related  Workup: ANA HIV testing Zinc levels I discussed with the patient that the levels are not serious or significant to warrant a bone marrow biopsy  right now. Assessment and Plan Assessment & Plan Lymphocytopenia Chronic lymphocytopenia since 2015 with fluctuating WBC counts, primarily reduced lymphocytes. Differential includes viral causes, medication effects, autoimmune conditions, zinc deficiency, and bone marrow causes. Viral causes and medication effects are possible contributors. No bone marrow issues as red cells and platelets are normal. No severe infections, COVID-19, or autoimmune conditions. Family history of autoimmune conditions. Current levels not causing clinical issues, no treatment indicated. Valtrex  necessary for herpes zoster, role in lymphocytopenia uncertain. - Order blood test for lupus and zinc levels. - Add CBC to evaluate blood smear. - Review results and discuss findings via phone call.     All questions were answered. The patient knows to call the clinic with any problems, questions or concerns.    Viinay K Elle Vezina, MD 11/09/23

## 2023-11-09 NOTE — Assessment & Plan Note (Signed)
 Lab review: 12/21/2022: WBC 3.1, hemoglobin 13.2, platelets 243 07/01/2023: WBC 3.5, hemoglobin 14.2, ANC 2.5, ALC 0.8, AMC 0.2, CMP: Normal 09/30/2023: WBC 3, hemoglobin 13.5, platelets 227, ANC 1.9, ALC 0.8, AMC 0.2  Mild leukopenia especially lymphopenia: Differential diagnosis: Medications Illnesses/infections: Viruses Autoimmune conditions like lupus Bone marrow conditions Nutrition causes like zinc deficiency Ethnicity related  Workup: ANA HIV testing Zinc levels I discussed with the patient that the levels are not serious or significant to warrant a bone marrow biopsy  right now.

## 2023-11-11 LAB — ZINC: Zinc: 80 ug/dL (ref 44–115)

## 2023-11-13 LAB — ANTINUCLEAR ANTIBODIES, IFA: ANA Ab, IFA: NEGATIVE

## 2023-11-16 ENCOUNTER — Inpatient Hospital Stay (HOSPITAL_BASED_OUTPATIENT_CLINIC_OR_DEPARTMENT_OTHER): Admitting: Hematology and Oncology

## 2023-11-16 DIAGNOSIS — D72819 Decreased white blood cell count, unspecified: Secondary | ICD-10-CM | POA: Diagnosis not present

## 2023-11-16 NOTE — Progress Notes (Signed)
 HEMATOLOGY-ONCOLOGY TELEPHONE VISIT PROGRESS NOTE  I connected with our patient on 11/16/23 at  2:45 PM EDT by telephone and verified that I am speaking with the correct person using two identifiers.  I discussed the limitations, risks, security and privacy concerns of performing an evaluation and management service by telephone and the availability of in person appointments.  I also discussed with the patient that there may be a patient responsible charge related to this service. The patient expressed understanding and agreed to proceed.   History of Present Illness:   History of Present Illness Michelle Grimes is a 63 year old female who presents for follow-up of her blood work results.  Her white blood cell count is stable, with a lymphocyte count of 0.7. There is a discrepancy between her lab's lower cutoff and her primary care doctor's lab, which considers this count as low.    REVIEW OF SYSTEMS:   Constitutional: Denies fevers, chills or abnormal weight loss All other systems were reviewed with the patient and are negative. Observations/Objective:     Assessment Plan:  Leukopenia Lab review: 12/21/2022: WBC 3.1, hemoglobin 13.2, platelets 243 07/01/2023: WBC 3.5, hemoglobin 14.2, ANC 2.5, ALC 0.8, AMC 0.2, CMP: Normal 09/30/2023: WBC 3, hemoglobin 13.5, platelets 227, ANC 1.9, ALC 0.8, AMC 0.2 11/09/2023: WBC: 4, Hb 14.5, Platelets 210, ALC 0.7, Zinc : 80, ANA Neg  No clear cut etiology for the borderline lymphocytopenia. It is considered normal based on our labs No further work up or BM bx are recommended RTC on an as needed basis Assessment & Plan Lymphopenia Lymphocyte count stable at 0.7. Zinc  normal, lupus negative. No treatment required. - Monitor lymphocyte count regularly. - Communicated findings to primary care physician. - No further workup or bone marrow biopsy needed. - Available for consultation if hematological issues arise.      I discussed the assessment  and treatment plan with the patient. The patient was provided an opportunity to ask questions and all were answered. The patient agreed with the plan and demonstrated an understanding of the instructions. The patient was advised to call back or seek an in-person evaluation if the symptoms worsen or if the condition fails to improve as anticipated.   I provided 10 minutes of non-face-to-face time during this encounter.  This includes time for charting and coordination of care   Margert Sheerer, MD

## 2023-11-16 NOTE — Assessment & Plan Note (Signed)
 Lab review: 12/21/2022: WBC 3.1, hemoglobin 13.2, platelets 243 07/01/2023: WBC 3.5, hemoglobin 14.2, ANC 2.5, ALC 0.8, AMC 0.2, CMP: Normal 09/30/2023: WBC 3, hemoglobin 13.5, platelets 227, ANC 1.9, ALC 0.8, AMC 0.2 11/09/2023: WBC 4.9, hemoglobin 13.8, platelets 240, ANC 3, ALC 0.7 11/16/2023: WBC: 4, Hb 14.5, Platelets 210, ALC 0.7, Zinc : 80, ANA Neg  No clear cut etiology for the borderline lymphocytopenia. No further work up or BM bx are recommended

## 2023-12-22 DIAGNOSIS — Z0000 Encounter for general adult medical examination without abnormal findings: Secondary | ICD-10-CM | POA: Diagnosis not present

## 2023-12-22 DIAGNOSIS — Z1159 Encounter for screening for other viral diseases: Secondary | ICD-10-CM | POA: Diagnosis not present

## 2023-12-22 DIAGNOSIS — I10 Essential (primary) hypertension: Secondary | ICD-10-CM | POA: Diagnosis not present

## 2023-12-22 DIAGNOSIS — Z23 Encounter for immunization: Secondary | ICD-10-CM | POA: Diagnosis not present

## 2023-12-22 DIAGNOSIS — E782 Mixed hyperlipidemia: Secondary | ICD-10-CM | POA: Diagnosis not present

## 2023-12-22 DIAGNOSIS — D708 Other neutropenia: Secondary | ICD-10-CM | POA: Diagnosis not present

## 2023-12-22 DIAGNOSIS — E039 Hypothyroidism, unspecified: Secondary | ICD-10-CM | POA: Diagnosis not present

## 2023-12-22 DIAGNOSIS — H8103 Meniere's disease, bilateral: Secondary | ICD-10-CM | POA: Diagnosis not present

## 2023-12-22 DIAGNOSIS — R0982 Postnasal drip: Secondary | ICD-10-CM | POA: Diagnosis not present

## 2023-12-28 DIAGNOSIS — R21 Rash and other nonspecific skin eruption: Secondary | ICD-10-CM | POA: Diagnosis not present

## 2023-12-28 DIAGNOSIS — T50Z95A Adverse effect of other vaccines and biological substances, initial encounter: Secondary | ICD-10-CM | POA: Diagnosis not present

## 2024-03-28 ENCOUNTER — Ambulatory Visit (INDEPENDENT_AMBULATORY_CARE_PROVIDER_SITE_OTHER): Admitting: Otolaryngology

## 2024-03-28 VITALS — BP 170/94 | HR 98 | Temp 97.9°F | Ht 61.0 in | Wt 158.0 lb

## 2024-03-28 DIAGNOSIS — J310 Chronic rhinitis: Secondary | ICD-10-CM

## 2024-03-28 DIAGNOSIS — R0981 Nasal congestion: Secondary | ICD-10-CM

## 2024-03-28 DIAGNOSIS — J342 Deviated nasal septum: Secondary | ICD-10-CM | POA: Diagnosis not present

## 2024-03-28 DIAGNOSIS — R0982 Postnasal drip: Secondary | ICD-10-CM | POA: Diagnosis not present

## 2024-03-28 MED ORDER — IPRATROPIUM BROMIDE 0.06 % NA SOLN: 2.0000 | Freq: Two times a day (BID) | NASAL | 12 refills | Status: AC | PRN

## 2024-03-28 NOTE — Progress Notes (Signed)
 New complaints: Chronic nasal congestion, postnasal drainage  HPI: Discussed the use of AI scribe software for clinical note transcription with the patient, who gave verbal consent to proceed.  History of Present Illness Michelle Grimes is a 63 year old female who presents today complaining of nasal congestion and postnasal drainage.  She began experiencing nasal congestion and postnasal drainage on February 25, 2024, accompanied by a 'tickle kind of burning' in her throat. This progressed to flu-like symptoms, including a sore throat and postnasal drainage. She did not take Tamiflu, opting instead to allow her body to recover naturally.  The postnasal drip has caused coughing, which she identifies as the 'last part of the flu.' She manages the symptoms by elevating her bed, saline nasal sprays, and Breathe Right strips. She has reduced her dairy intake, which she notes as a trigger for increased mucus production, and has increased her water intake.  She has a history of a deviated septum and reports some congestion, which she notes is worse during high pollen seasons. She uses ipratropium bromide  (Atrovent ) nasal spray as needed to manage drainage.  She has not been sick in seven and a half years prior to this episode, having avoided flu, cold, and COVID-19. She receives a flu shot annually and had a pneumonia shot last year.  Exam: General: Communicates without difficulty, well nourished, no acute distress. Head: Normocephalic, no evidence injury, no tenderness, facial buttresses intact without stepoff. Face/sinus: No tenderness to palpation and percussion. Facial movement is normal and symmetric. Eyes: PERRL, EOMI. No scleral icterus, conjunctivae clear. Neuro: CN II exam reveals vision grossly intact.  No nystagmus at any point of gaze. Ears: Auricles well formed without lesions.  Ear canals are intact without mass or lesion.  No erythema or edema is appreciated.  The TMs are intact without  fluid. Nose: External evaluation reveals normal support and skin without lesions.  Dorsum is intact.  Anterior rhinoscopy reveals congested mucosa over anterior aspect of inferior turbinates and intact septum.  No purulence noted. Oral:  Oral cavity and oropharynx are intact, symmetric, without erythema or edema.  Mucosa is moist without lesions. Neck: Full range of motion without pain.  There is no significant lymphadenopathy.  No masses palpable.  Thyroid  bed within normal limits to palpation.  Parotid glands and submandibular glands equal bilaterally without mass.  Trachea is midline. Neuro:  CN 2-12 grossly intact.   Assessment and Plan Assessment & Plan Chronic nasal congestion with postnasal drainage Chronic nasal congestion with postnasal drainage has improved since the last visit. Recent flu episode with postnasal drainage and cough has resolved. Current nasal congestion is likely allergy-related due to high pollen levels. No signs of infection, pus, or polyps on examination. - Prescribe Atrovent  (ipratropium bromide ) nasal spray for postnasal drainage, to be used as needed. - Encourage continued hydration to thin mucus.  Deviated nasal septum Deviated nasal septum remains unchanged from previous examination. Mild congestion is present, but no infection or significant obstruction is observed.

## 2024-03-29 DIAGNOSIS — R0982 Postnasal drip: Secondary | ICD-10-CM | POA: Insufficient documentation

## 2024-03-29 DIAGNOSIS — J342 Deviated nasal septum: Secondary | ICD-10-CM | POA: Insufficient documentation

## 2024-03-29 DIAGNOSIS — J310 Chronic rhinitis: Secondary | ICD-10-CM | POA: Insufficient documentation

## 2024-04-02 DIAGNOSIS — Z23 Encounter for immunization: Secondary | ICD-10-CM | POA: Diagnosis not present

## 2024-05-29 ENCOUNTER — Encounter: Payer: Self-pay | Admitting: Obstetrics and Gynecology

## 2024-05-29 ENCOUNTER — Ambulatory Visit: Admitting: Obstetrics and Gynecology

## 2024-05-29 VITALS — BP 160/94 | HR 107

## 2024-05-29 DIAGNOSIS — R030 Elevated blood-pressure reading, without diagnosis of hypertension: Secondary | ICD-10-CM | POA: Diagnosis not present

## 2024-05-29 DIAGNOSIS — G8929 Other chronic pain: Secondary | ICD-10-CM

## 2024-05-29 DIAGNOSIS — R1031 Right lower quadrant pain: Secondary | ICD-10-CM | POA: Diagnosis not present

## 2024-05-29 NOTE — Progress Notes (Signed)
 "  GYNECOLOGY  VISIT   HPI: 63 y.o.   Married  Caucasian female   G2P2002 with Patient's last menstrual period was 04/16/2002.   here for: R ovarian pain- aching but not necessarily pain.   Pain started Oct 28, 2023. Off and on pain.  Family hx ovarian cancer - maternal grandmother    Pain started as an aching in May.  Comes and goes. Does not prevent sleep and and rest.    Intensity is 1 - 2/10 scale.     Pain started again May 20, 2024 and is an achiness.   Heating pad makes it better.   Not using pain medication.   No vaginal bleeding.   Nor urinary frequency.   Bowel movements daily.     Patients she has BP elevation at medical offices.  BP at home 115/55 per patient.     GYNECOLOGIC HISTORY: Patient's last menstrual period was 04/16/2002. Contraception:  PMP Menopausal hormone therapy:  n/a Last 2 paps:  07/04/20 neg HR HPV neg  History of abnormal Pap or positive HPV:  no Mammogram:  01/06/23 Breast Density Cat C, BIRADS Cat 1 neg         OB History     Gravida  2   Para  2   Term  2   Preterm  0   AB  0   Living  2      SAB  0   IAB  0   Ectopic  0   Multiple  0   Live Births  2              Patient Active Problem List   Diagnosis Date Noted   Chronic rhinitis 03/29/2024   Postnasal drip 03/29/2024   Deviated nasal septum 03/29/2024   Leukopenia 11/09/2023   Cystocele 05/10/2014   Rectocele 05/10/2014    Past Medical History:  Diagnosis Date   Hyperlipidemia    Hypothyroid 03/2000   Premature ovarian failure 08/2001   STD (sexually transmitted disease)    HSV    Past Surgical History:  Procedure Laterality Date   CESAREAN SECTION      Current Outpatient Medications  Medication Sig Dispense Refill   atorvastatin (LIPITOR) 10 MG tablet Take 10 mg by mouth daily at 6 PM.      CALCIUM PO Take 2,568 mg by mouth at bedtime. Supplement and dietary intake     ipratropium (ATROVENT ) 0.06 % nasal spray Place 2 sprays into  both nostrils 2 (two) times daily as needed (drainage). 15 mL 12   levothyroxine (SYNTHROID, LEVOTHROID) 75 MCG tablet Take 75 mcg by mouth daily before breakfast.      loratadine (CLARITIN) 10 MG tablet Take 10 mg by mouth daily.     Multiple Vitamin (MULTIVITAMIN) tablet Take 1 tablet by mouth at bedtime.      NON FORMULARY Take 400 Units by mouth at bedtime. VITAMIN D. Supplement and dietary     triamcinolone cream (KENALOG) 0.1 % Apply topically 2 (two) times daily.     valACYclovir  (VALTREX ) 1000 MG tablet take 1 tablet by mouth daily if needed 30 tablet 12   No current facility-administered medications for this visit.     ALLERGIES: Amoxicillin and Azithromycin  Family History  Problem Relation Age of Onset   Stroke Mother    Rheum arthritis Mother    Cancer Maternal Grandmother 55       ovarian    Social History   Socioeconomic History  Marital status: Married    Spouse name: Not on file   Number of children: Not on file   Years of education: Not on file   Highest education level: Not on file  Occupational History   Not on file  Tobacco Use   Smoking status: Never   Smokeless tobacco: Never  Vaping Use   Vaping status: Never Used  Substance and Sexual Activity   Alcohol use: No   Drug use: No   Sexual activity: Yes    Partners: Male    Birth control/protection: Surgical, Post-menopausal    Comment: vasectomy  Other Topics Concern   Not on file  Social History Narrative   Not on file   Social Drivers of Health   Tobacco Use: Low Risk (05/29/2024)   Patient History    Smoking Tobacco Use: Never    Smokeless Tobacco Use: Never    Passive Exposure: Not on file  Financial Resource Strain: Not on file  Food Insecurity: No Food Insecurity (11/09/2023)   Hunger Vital Sign    Worried About Running Out of Food in the Last Year: Never true    Ran Out of Food in the Last Year: Never true  Transportation Needs: No Transportation Needs (11/09/2023)   PRAPARE -  Administrator, Civil Service (Medical): No    Lack of Transportation (Non-Medical): No  Physical Activity: Not on file  Stress: Not on file  Social Connections: Not on file  Intimate Partner Violence: Not At Risk (11/09/2023)   Humiliation, Afraid, Rape, and Kick questionnaire    Fear of Current or Ex-Partner: No    Emotionally Abused: No    Physically Abused: No    Sexually Abused: No  Depression (PHQ2-9): Low Risk (11/09/2023)   Depression (PHQ2-9)    PHQ-2 Score: 0  Alcohol Screen: Not on file  Housing: Low Risk (11/09/2023)   Housing Stability Vital Sign    Unable to Pay for Housing in the Last Year: No    Number of Times Moved in the Last Year: 0    Homeless in the Last Year: No  Utilities: Not At Risk (11/09/2023)   [employer] Utilities    Threatened with loss of utilities: No  Health Literacy: Not on file    Review of Systems  All other systems reviewed and are negative.   PHYSICAL EXAMINATION:   BP (!) 160/94 (BP Location: Right Arm, Patient Position: Sitting)   Pulse (!) 107   LMP 04/16/2002   SpO2 98%     General appearance: alert, cooperative and appears stated age.   No acute distress.   Abdomen: soft, non-tender, no masses,  no organomegaly   No abnormal inguinal nodes palpated   Pelvic: External genitalia:  no lesions              Urethra:  normal appearing urethra with no masses, tenderness or lesions              Bartholins and Skenes: normal                 Vagina: normal appearing vagina with normal color and discharge, no lesions              Cervix: no lesions                Bimanual Exam:  Uterus:  normal size, contour, position, consistency, mobility, non-tender              Adnexa: no mass, fullness,  tenderness              Rectal exam: yes.  Confirms.              Anus:  normal sphincter tone, no lesions  Chaperone was present for exam:  Kari HERO, CMA  ASSESSMENT:  RLQ pain.   Reassuring exam today.  Elevated blood pressure readings.    PLAN:  Return for pelvic US . Declines Rx for Motrin.   She will check her blood pressure at home and see her provider if it remains elevated.  She understands I have concerns about the level her blood pressure has reached today.       "

## 2024-07-17 ENCOUNTER — Other Ambulatory Visit: Admitting: Obstetrics and Gynecology

## 2024-07-17 ENCOUNTER — Other Ambulatory Visit
